# Patient Record
Sex: Male | Born: 1965
Health system: Southern US, Community
[De-identification: ages and names within clinical notes are randomized; demographics above are authoritative.]

## PROBLEM LIST (undated history)

## (undated) DIAGNOSIS — Z8249 Family history of ischemic heart disease and other diseases of the circulatory system: Secondary | ICD-10-CM

## (undated) DIAGNOSIS — K219 Gastro-esophageal reflux disease without esophagitis: Secondary | ICD-10-CM

## (undated) DIAGNOSIS — E785 Hyperlipidemia, unspecified: Secondary | ICD-10-CM

## (undated) DIAGNOSIS — T7840XA Allergy, unspecified, initial encounter: Secondary | ICD-10-CM

## (undated) HISTORY — DX: Allergy, unspecified, initial encounter: T78.40XA

## (undated) HISTORY — DX: Hyperlipidemia, unspecified: E78.5

## (undated) HISTORY — DX: Family history of ischemic heart disease and other diseases of the circulatory system: Z82.49

## (undated) HISTORY — DX: Gastro-esophageal reflux disease without esophagitis: K21.9

## (undated) HISTORY — PX: APPENDECTOMY: SHX54

## (undated) HISTORY — PX: WISDOM TOOTH EXTRACTION: SHX21

## (undated) HISTORY — PX: EYE SURGERY: SHX253

---

## 2001-05-28 ENCOUNTER — Encounter: Admission: RE | Admit: 2001-05-28 | Discharge: 2001-05-28 | Payer: Self-pay | Admitting: Internal Medicine

## 2001-05-28 ENCOUNTER — Encounter: Payer: Self-pay | Admitting: Internal Medicine

## 2005-04-04 ENCOUNTER — Ambulatory Visit: Payer: Self-pay | Admitting: Internal Medicine

## 2005-10-12 ENCOUNTER — Ambulatory Visit: Payer: Self-pay | Admitting: Internal Medicine

## 2006-05-22 ENCOUNTER — Ambulatory Visit: Payer: Self-pay | Admitting: Internal Medicine

## 2006-12-14 ENCOUNTER — Ambulatory Visit: Payer: Self-pay | Admitting: Internal Medicine

## 2006-12-21 ENCOUNTER — Ambulatory Visit: Payer: Self-pay | Admitting: Internal Medicine

## 2006-12-21 LAB — CONVERTED CEMR LAB
ALT: 22 units/L (ref 0–40)
AST: 24 units/L (ref 0–37)
Albumin: 4.3 g/dL (ref 3.5–5.2)
Alkaline Phosphatase: 76 units/L (ref 39–117)
BUN: 13 mg/dL (ref 6–23)
Basophils Absolute: 0 10*3/uL (ref 0.0–0.1)
Basophils Relative: 0.5 % (ref 0.0–1.0)
CO2: 31 meq/L (ref 19–32)
Calcium: 9.8 mg/dL (ref 8.4–10.5)
Chloride: 107 meq/L (ref 96–112)
Creatinine, Ser: 1 mg/dL (ref 0.4–1.5)
Eosinophils Relative: 2.5 % (ref 0.0–5.0)
GFR calc Af Amer: 106 mL/min
GFR calc non Af Amer: 88 mL/min
Glucose, Bld: 93 mg/dL (ref 70–99)
HCT: 41.6 % (ref 39.0–52.0)
Hemoglobin: 14.1 g/dL (ref 13.0–17.0)
Lymphocytes Relative: 31.1 % (ref 12.0–46.0)
MCHC: 33.9 g/dL (ref 30.0–36.0)
MCV: 91.3 fL (ref 78.0–100.0)
Monocytes Absolute: 0.6 10*3/uL (ref 0.2–0.7)
Monocytes Relative: 9.2 % (ref 3.0–11.0)
Neutro Abs: 4 10*3/uL (ref 1.4–7.7)
Neutrophils Relative %: 56.7 % (ref 43.0–77.0)
Platelets: 296 10*3/uL (ref 150–400)
Potassium: 3.9 meq/L (ref 3.5–5.1)
RBC: 4.56 M/uL (ref 4.22–5.81)
RDW: 12 % (ref 11.5–14.6)
Sodium: 142 meq/L (ref 135–145)
TSH: 2.1 microintl units/mL (ref 0.35–5.50)
Total Bilirubin: 0.9 mg/dL (ref 0.3–1.2)
Total Protein: 7.3 g/dL (ref 6.0–8.3)
WBC: 6.9 10*3/uL (ref 4.5–10.5)

## 2006-12-24 ENCOUNTER — Encounter: Payer: Self-pay | Admitting: Internal Medicine

## 2006-12-28 ENCOUNTER — Ambulatory Visit: Payer: Self-pay | Admitting: Internal Medicine

## 2007-01-10 ENCOUNTER — Ambulatory Visit: Payer: Self-pay | Admitting: Internal Medicine

## 2007-01-10 LAB — CONVERTED CEMR LAB
ALT: 19 units/L (ref 0–40)
AST: 20 units/L (ref 0–37)
Albumin: 4.5 g/dL (ref 3.5–5.2)
Alkaline Phosphatase: 75 units/L (ref 39–117)
BUN: 13 mg/dL (ref 6–23)
Basophils Absolute: 0 10*3/uL (ref 0.0–0.1)
Basophils Relative: 0.3 % (ref 0.0–1.0)
Bilirubin, Direct: 0.1 mg/dL (ref 0.0–0.3)
CO2: 30 meq/L (ref 19–32)
Calcium: 9.8 mg/dL (ref 8.4–10.5)
Chloride: 103 meq/L (ref 96–112)
Creatinine, Ser: 1 mg/dL (ref 0.4–1.5)
Eosinophils Absolute: 0 10*3/uL (ref 0.0–0.6)
Eosinophils Relative: 0.8 % (ref 0.0–5.0)
GFR calc Af Amer: 106 mL/min
GFR calc non Af Amer: 88 mL/min
Glucose, Bld: 92 mg/dL (ref 70–99)
HCT: 43.6 % (ref 39.0–52.0)
Hemoglobin: 15.2 g/dL (ref 13.0–17.0)
Hgb A1c MFr Bld: 5 % (ref 4.6–6.0)
Lymphocytes Relative: 24 % (ref 12.0–46.0)
MCHC: 34.8 g/dL (ref 30.0–36.0)
MCV: 91 fL (ref 78.0–100.0)
Monocytes Absolute: 0.5 10*3/uL (ref 0.2–0.7)
Monocytes Relative: 9 % (ref 3.0–11.0)
Neutro Abs: 4 10*3/uL (ref 1.4–7.7)
Neutrophils Relative %: 65.9 % (ref 43.0–77.0)
Platelets: 266 10*3/uL (ref 150–400)
Potassium: 4.2 meq/L (ref 3.5–5.1)
RBC: 4.79 M/uL (ref 4.22–5.81)
RDW: 12 % (ref 11.5–14.6)
Sodium: 138 meq/L (ref 135–145)
TSH: 1.8 microintl units/mL (ref 0.35–5.50)
Total Bilirubin: 0.5 mg/dL (ref 0.3–1.2)
Total Protein: 7.3 g/dL (ref 6.0–8.3)
WBC: 5.9 10*3/uL (ref 4.5–10.5)

## 2007-05-17 ENCOUNTER — Ambulatory Visit: Payer: Self-pay | Admitting: Internal Medicine

## 2007-05-19 LAB — CONVERTED CEMR LAB
Cholesterol: 228 mg/dL (ref 0–200)
Direct LDL: 146.2 mg/dL
HDL: 35.4 mg/dL — ABNORMAL LOW (ref >39.0)
Total CHOL/HDL Ratio: 6.4
Triglycerides: 191 mg/dL — ABNORMAL HIGH (ref 0–149)
VLDL: 38 mg/dL (ref 0–40)

## 2007-09-06 ENCOUNTER — Ambulatory Visit: Payer: Self-pay | Admitting: Internal Medicine

## 2007-09-06 DIAGNOSIS — E782 Mixed hyperlipidemia: Secondary | ICD-10-CM

## 2007-09-06 LAB — CONVERTED CEMR LAB
Cholesterol, target level: 200 mg/dL
HDL goal, serum: 40 mg/dL
LDL Goal: 160 mg/dL

## 2007-10-20 ENCOUNTER — Encounter: Payer: Self-pay | Admitting: Internal Medicine

## 2010-05-03 ENCOUNTER — Ambulatory Visit: Payer: Self-pay | Admitting: Internal Medicine

## 2010-05-04 ENCOUNTER — Encounter: Payer: Self-pay | Admitting: Internal Medicine

## 2010-05-13 ENCOUNTER — Ambulatory Visit: Payer: Self-pay | Admitting: Internal Medicine

## 2010-05-20 LAB — CONVERTED CEMR LAB
ALT: 22 units/L (ref 0–53)
AST: 24 units/L (ref 0–37)
Albumin: 4.2 g/dL (ref 3.5–5.2)
Alkaline Phosphatase: 68 units/L (ref 39–117)
BUN: 16 mg/dL (ref 6–23)
Basophils Absolute: 0 10*3/uL (ref 0.0–0.1)
Basophils Relative: 0.4 % (ref 0.0–3.0)
Bilirubin, Direct: 0.1 mg/dL (ref 0.0–0.3)
CO2: 30 meq/L (ref 19–32)
Calcium: 9.3 mg/dL (ref 8.4–10.5)
Chloride: 106 meq/L (ref 96–112)
Cholesterol: 226 mg/dL — ABNORMAL HIGH (ref 0–200)
Creatinine, Ser: 0.9 mg/dL (ref 0.4–1.5)
Direct LDL: 152.9 mg/dL
Eosinophils Absolute: 0.2 10*3/uL (ref 0.0–0.7)
Eosinophils Relative: 3.3 % (ref 0.0–5.0)
GFR calc non Af Amer: 97.56 mL/min (ref >60)
Glucose, Bld: 91 mg/dL (ref 70–99)
HCT: 42.9 % (ref 39.0–52.0)
HDL: 43.1 mg/dL (ref >39.00)
Hemoglobin: 14.9 g/dL (ref 13.0–17.0)
Lymphocytes Relative: 28.9 % (ref 12.0–46.0)
Lymphs Abs: 1.8 10*3/uL (ref 0.7–4.0)
MCHC: 34.6 g/dL (ref 30.0–36.0)
MCV: 94.1 fL (ref 78.0–100.0)
Monocytes Absolute: 0.7 10*3/uL (ref 0.1–1.0)
Monocytes Relative: 11 % (ref 3.0–12.0)
Neutro Abs: 3.4 10*3/uL (ref 1.4–7.7)
Neutrophils Relative %: 56.4 % (ref 43.0–77.0)
Platelets: 262 10*3/uL (ref 150.0–400.0)
Potassium: 4.5 meq/L (ref 3.5–5.1)
RBC: 4.56 M/uL (ref 4.22–5.81)
RDW: 13.9 % (ref 11.5–14.6)
Sodium: 141 meq/L (ref 135–145)
TSH: 2.03 microintl units/mL (ref 0.35–5.50)
Total Bilirubin: 0.7 mg/dL (ref 0.3–1.2)
Total CHOL/HDL Ratio: 5
Total Protein: 6.9 g/dL (ref 6.0–8.3)
Triglycerides: 128 mg/dL (ref 0.0–149.0)
VLDL: 25.6 mg/dL (ref 0.0–40.0)
WBC: 6.1 10*3/uL (ref 4.5–10.5)

## 2010-10-07 ENCOUNTER — Ambulatory Visit: Payer: Self-pay | Admitting: Internal Medicine

## 2010-10-07 DIAGNOSIS — J069 Acute upper respiratory infection, unspecified: Secondary | ICD-10-CM | POA: Insufficient documentation

## 2010-10-07 DIAGNOSIS — S8990XA Unspecified injury of unspecified lower leg, initial encounter: Secondary | ICD-10-CM | POA: Insufficient documentation

## 2010-10-07 DIAGNOSIS — S99929A Unspecified injury of unspecified foot, initial encounter: Secondary | ICD-10-CM

## 2010-10-07 DIAGNOSIS — S99919A Unspecified injury of unspecified ankle, initial encounter: Secondary | ICD-10-CM

## 2011-01-10 NOTE — Assessment & Plan Note (Signed)
Summary: [employer] physical/nta   Vital Signs:  Patient profile:   45 year old male Height:      68.25 inches Weight:      213 pounds BMI:     32.27 Temp:     98.4 degrees F oral Pulse rate:   80 / minute Resp:     20 per minute BP sitting:   130 / 82  (left arm)  Vitals Entered By: Jeremy Johann CMA (May 03, 2010 3:42 PM)  CC: yearly, General Medical Evaluation, Lipid Management Comments REVIEWED MED LIST, PATIENT AGREED DOSE AND INSTRUCTION CORRECT    CC:  yearly, General Medical Evaluation, and Lipid Management.  History of Present Illness: Ryan Reyes is here for a physical; he is essentially asymptomatic.  Lipid Management History:      Positive NCEP/ATP III risk factors include HDL cholesterol less than 40.  Negative NCEP/ATP III risk factors include male age less than 21 years old, non-diabetic, no family history for ischemic heart disease, non-tobacco-user status, non-hypertensive, no ASHD (atherosclerotic heart disease), no prior stroke/TIA, no peripheral vascular disease, and no history of aortic aneurysm.     Preventive Screening-Counseling & Management  Caffeine-Diet-Exercise     Does Patient Exercise: yes  Allergies: 1)  ! Pcn  Past History:  Past Medical History: ERD with + H.pylori 1999 Hyperlipidemia: NMR 200*: LDL 170(2151/1361),HDL 44, TG 141.LDL goal = < 100 based on NMR. [place] LDL goal + < 160.  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         Weight gain of 4# with carbs.  Physical Exam  General:  well-nourished; alert,appropriate and cooperative throughout examination Head:  Normocephalic and atraumatic without obvious abnormalities. No apparent alopecia  Eyes:  No corneal or conjunctival inflammation noted.Perrla. Funduscopic exam benign, without hemorrhages, exudates or papilledema. Vision grossly normal. Ears:  External ear exam shows no significant lesions or deformities.  Otoscopic examination reveals clear canals, tympanic membranes are intact bilaterally without bulging, retraction, inflammation or discharge. Hearing is grossly normal bilaterally. Nose:  External nasal examination shows no deformity or inflammation. Nasal mucosa are pink and moist without lesions or exudates. Mouth:  Oral mucosa and oropharynx without lesions or exudates.  Teeth in good repair. Neck:  No deformities, masses, or tenderness noted.Slight asymmetry of thyroid w/o nodules Lungs:  Normal respiratory effort, chest expands symmetrically. Lungs are clear to auscultation, no crackles or wheezes. Heart:  Normal rate and regular rhythm. S1 and S2 normal without gallop, murmur, click, rub .S4  Abdomen:  Bowel sounds positive,abdomen soft and non-tender without masses, organomegaly or hernias noted. Rectal:  No external abnormalities noted. Normal sphincter tone. No rectal masses or tenderness. Genitalia:  Testes bilaterally descended without nodularity, tenderness or masses. No scrotal masses or lesions. No penis lesions or urethral discharge. Prostate:  Prostate gland firm and smooth, no enlargement, nodularity, tenderness, mass, asymmetry or induration. Msk:  No deformity or scoliosis  noted of thoracic or lumbar spine.   Pulses:  R and L  carotid,radial,dorsalis pedis and posterior tibial pulses are full and equal bilaterally Extremities:  No clubbing, cyanosis, edema, or deformity noted with normal full range of motion of all joints.   Neurologic:  alert & oriented X3 and DTRs symmetrical and normal.   Skin:  Intact without suspicious lesions or rashes Cervical Nodes:  No lymphadenopathy noted Axillary Nodes:  No palpable lymphadenopathy Psych:  memory intact for recent and remote, normally interactive, and good eye contact.     Impression & Recommendations:  Problem # 1:  ROUTINE GENERAL MEDICAL EXAM@HEALTH  CARE FACL (ICD-V70.0)  Orders: EKG w/ Interpretation (93000)  Problem # 2:  HYPERLIPIDEMIA (ICD-272.2)  Orders: EKG w/ Interpretation (93000)  Problem # 3:  GERD (ICD-530.81) Quiescent His updated medication list for this problem includes:    Nexium 40 Mg Cpdr (Esomeprazole magnesium) .Marland Kitchen... 1 by mouth once daily as needed only  Problem # 4:  CORONARY ARTERY DISEASE, FAMILY HX (ICD-V17.3) F MI @ 57  Complete Medication List: 1)  Nexium 40 Mg Cpdr (Esomeprazole magnesium) .Marland Kitchen.. 1 by mouth once daily as needed only  Other Orders: Tdap => 56yrs IM (73710) Admin 1st Vaccine (62694) Admin 1st Vaccine (State) [phone])  Lipid Assessment/Plan:      Based on NCEP/ATP III, the patient's risk factor category is "0-1 risk factors".  The patient's lipid goals are as follows: Total cholesterol goal is 200; LDL cholesterol goal is 160; HDL cholesterol goal is 40; Triglyceride goal is 150.    Patient Instructions: 1)  It is important that you exercise regularly at least 20 minutes 5 times a week. If you develop chest pain, have severe difficulty breathing, or feel very tired , stop exercising immediately and seek medical attention.Take an 81 mg COATED  Aspirin every day with a meal.Consider fasting labs: 2)  BMP ; 3)  Hepatic Panel; 4)  Lipid Panel ; 5)  TSH ; 6)  CBC w/ Diff . 7)  Avoid foods high in acid (tomatoes,  citrus juices, spicy foods). Avoid eating within two hours of lying down or before exercising. Do not over eat; try smaller more frequent meals. Elevate head of bed twelve inches when sleeping. Consume LESS THAN 40 grams of "sugar "/ day from foods & drinks with High Fructose Corn Syrup as #1,2 or # 3 on label. Prescriptions: NEXIUM 40 MG  CPDR (ESOMEPRAZOLE MAGNESIUM) 1 by mouth once daily as needed only  #30 x 5   Entered and Authorized by:   Marga Melnick MD   Signed by:   Marga Melnick MD on 05/03/2010   Method used:   Print then Give to Patient   RxID:   [id number]    Tetanus/Td Vaccine    Vaccine Type: Tdap    Site: right deltoid    Mfr: [email or web address]    Dose: 0.5 ml    Given by: Jeremy Johann CMA    Exp. Date: 03/04/2012    Lot #: [id number]    VIS given: 10/29/07 version given May 03, 2010.

## 2011-01-10 NOTE — Assessment & Plan Note (Signed)
Summary: sinus pain/jamed toe/cbs   Vital Signs:  Patient profile:   45 year old male Weight:      217.8 pounds BMI:     32.99 Temp:     98.2 degrees F oral Pulse rate:   72 / minute Resp:     14 per minute BP sitting:   116 / 74  (left arm) Cuff size:   large  Vitals Entered By: Shonna Chock CMA (October 07, 2010 2:42 PM) CC: 1.) Sinus Issues x 1 day    2.)  Jamed toes (right foot), URI symptoms, Lower Extremity Joint pain   CC:  1.) Sinus Issues x 1 day    2.)  Jamed toes (right foot), URI symptoms, and Lower Extremity Joint pain.  History of Present Illness:      This is a 45 year old man who presents with  L paranasal pressure.  The patient denies nasal congestion, purulent nasal discharge, sore throat, dry cough, and earache.  The patient denies fever, dyspnea, and wheezing.  The patient denies itchy watery eyes, sneezing, and headache.  Risk factors for Strep sinusitis include only the  unilateral facial pain.  The patient denies the following risk factors for Strep sinusitis: tooth pain and tender adenopathy.   Rx : saline rinse with decreased pain.      The patient also presents with Lower Extremity Joint pain in 2nd & 3rd R toes since 05/2010.  The patient reports decreased ROM, but denies swelling and redness.  The pain began with a direct blow when he fell on beach from "skim" board onto toes. Rx: none.    Current Medications (verified): 1)  Nexium 40 Mg  Cpdr (Esomeprazole Magnesium) .Marland Kitchen.. 1 By Mouth Once Daily As Needed Only  Allergies: 1)  ! Pcn  Physical Exam  General:  well-nourished,in no acute distress; alert,appropriate and cooperative throughout examination Ears:  External ear exam shows no significant lesions or deformities.  Otoscopic examination reveals clear canals, tympanic membranes are intact bilaterally without bulging, retraction, inflammation or discharge. Hearing is grossly normal bilaterally. Nose:  External nasal examination shows no deformity or  inflammation. Nasal mucosa are pink and moist without lesions or exudates. Mouth:  Oral mucosa and oropharynx without lesions or exudates.  Teeth in good repair. Lungs:  Normal respiratory effort, chest expands symmetrically. Lungs are clear to auscultation, no crackles or wheezes. Extremities:  No clubbing, cyanosis, edema, or deformity noted with normal full range of motion of all joints.  Toes not tender to palpation  Skin:  Intact without suspicious lesions or rashes Cervical Nodes:  No lymphadenopathy noted Axillary Nodes:  No palpable lymphadenopathy   Impression & Recommendations:  Problem # 1:  URI (ICD-465.9)  Problem # 2:  TOE INJURY (ICD-959.7)  Complete Medication List: 1)  Nexium 40 Mg Cpdr (Esomeprazole magnesium) .Marland Kitchen.. 1 by mouth once daily as needed only  Patient Instructions: 1)  Neti pot once daily - two times a day as needed for pressure , followed by Nasonex spray. Report " pain , pus & fever".   Orders Added: 1)  Est. Patient Level III [16109]

## 2011-08-10 ENCOUNTER — Encounter: Payer: Self-pay | Admitting: Internal Medicine

## 2011-08-10 ENCOUNTER — Ambulatory Visit (INDEPENDENT_AMBULATORY_CARE_PROVIDER_SITE_OTHER): Payer: 59 | Admitting: Internal Medicine

## 2011-08-10 VITALS — BP 128/80 | HR 84 | Temp 97.7°F | Resp 12 | Ht 68.0 in | Wt 212.6 lb

## 2011-08-10 DIAGNOSIS — E782 Mixed hyperlipidemia: Secondary | ICD-10-CM

## 2011-08-10 DIAGNOSIS — Z0000 Encounter for general adult medical examination without abnormal findings: Secondary | ICD-10-CM

## 2011-08-10 NOTE — Patient Instructions (Addendum)
Preventive Health Care: Exercise at least 30-45 minutes a day,  3-4 days a week.  Eat a low-fat diet with lots of fruits and vegetables, up to 7-9 servings per day. .Consume less than 40 grams of sugar per day from foods & drinks with High Fructose Corn Sugar as # 1,2,3 or # 4 on label. Risk of premature heart attack or stroke increases as LDL or BAD cholesterol rises.Advanced cholesterol panels optimally determine risk base on particle composition ( NMR Lipoprofile ) or by assessing multiple other genetic risks([facility] Heart Panel 1304X). These are indicated when LDL is > 130, especially if there is family history of heart attack in males before 37 or women before 58 .Your LDL goal = < 100. Please review Dr Gildardo Griffes book Eat, Drink & Be Healthy for dietary cholesterol information. If you're having lower extremity cramps; take Mag/Cal ( Cal/Mag) each evening and perform isometrics prior to going to sleep. The best exercises for the low back include freestyle swimming, stretch aerobics, and yoga.  Please  schedule fasting Labs : BMET,Lipids, hepatic panel, CBC & dif, TSH(V70.0).

## 2011-08-10 NOTE — Progress Notes (Signed)
Subjective:    Patient ID: Ryan Reyes, male    DOB: 03-24-1966, 45 y.o.   MRN: [id number]  HPI  Mr. Ryan Reyes  is here for a physical;acute issues include intermittent cramps in legs & feet & intermittent ear pain.      Review of Systems Patient reports no  vision/ hearing changes,anorexia, weight change, fever ,adenopathy, persistant / recurrent hoarseness, swallowing issues, chest pain,palpitations, edema,persistant / recurrent cough, hemoptysis, dyspnea(rest, exertional, paroxysmal nocturnal), gastrointestinal  bleeding (melena, rectal bleeding), abdominal pain, excessive heart burn, GU symptoms( dysuria, hematuria, pyuria, voiding/incontinence  issues) syncope, focal weakness, memory loss,numbness & tingling, skin/hair/nail changes,depression, anxiety or  abnormal bruising/bleeding. He sees a Land for low back issues which occurs after repetitive activity.    Objective:   Physical Exam Gen.: Healthy and well-nourished in appearance. Alert, appropriate and cooperative throughout exam. Head: Normocephalic without obvious abnormalities;  no alopecia  Eyes: No corneal or conjunctival inflammation noted. Pupils equal round reactive to light and accommodation. Fundal exam is benign without hemorrhages, exudate, papilledema. Extraocular motion intact. Vision grossly normal. Ears: External  ear exam reveals no significant lesions or deformities. Canals clear .TMs normal. Hearing is grossly normal bilaterally. Nose: External nasal exam reveals no deformity or inflammation. Nasal mucosa are pink and moist. No lesions or exudates noted. Septum  normal  Mouth: Oral mucosa and oropharynx reveal no lesions or exudates. Teeth in good repair. Neck: No deformities, masses, or tenderness noted. Range of motion & . Thyroid normal. Lungs: Normal respiratory effort; chest expands symmetrically. Lungs are clear to auscultation without rales, wheezes, or increased work of breathing. Heart: Normal rate and  rhythm. Normal S1 and S2. No gallop, click, or rub. S4 w/o  murmur. Abdomen: Bowel sounds normal; abdomen soft and nontender. No masses, organomegaly or hernias noted. Genitalia/DRE : completely normal   .                                                                                   Musculoskeletal/extremities: No deformity or scoliosis noted of  the thoracic or lumbar spine. No clubbing, cyanosis, edema, or deformity noted. Range of motion  normal .Tone & strength  normal.Joints normal. Nail health  good. Vascular: Carotid, radial artery, dorsalis pedis and  posterior tibial pulses are full and equal. No bruits present. Neurologic: Alert and oriented x3. Deep tendon reflexes symmetrical and normal.         Skin: Intact without suspicious lesions or rashes. Lymph: No cervical, axillary, or inguinal lymphadenopathy present. Psych: Mood and affect are normal. Normally interactive                                                                                         Assessment & Plan:  #1 comprehensive physical exam; no acute findings #2 see Problem List with Assessments & Recommendations  #  3 intermittent in legs/feet cramps. Circulation peripherally is excellent   #4 ear pain, intermittent. Probable variant of eustachian tube dysfunction. Plan: see Orders   Note: EKG is normal; there's been slight decrease in the  T voltage in the lateral V. leads since 2011.

## 2011-08-11 ENCOUNTER — Other Ambulatory Visit: Payer: Self-pay | Admitting: Internal Medicine

## 2011-08-11 DIAGNOSIS — Z0000 Encounter for general adult medical examination without abnormal findings: Secondary | ICD-10-CM

## 2011-08-15 ENCOUNTER — Other Ambulatory Visit (INDEPENDENT_AMBULATORY_CARE_PROVIDER_SITE_OTHER): Payer: 59

## 2011-08-15 DIAGNOSIS — Z0000 Encounter for general adult medical examination without abnormal findings: Secondary | ICD-10-CM

## 2011-08-15 LAB — CBC WITH DIFFERENTIAL/PLATELET
Basophils Relative: 0.3 % (ref 0.0–3.0)
Hemoglobin: 14.6 g/dL (ref 13.0–17.0)
Lymphocytes Relative: 25.3 % (ref 12.0–46.0)
Monocytes Relative: 8.5 % (ref 3.0–12.0)
Neutro Abs: 4.5 10*3/uL (ref 1.4–7.7)
RBC: 4.62 Mil/uL (ref 4.22–5.81)

## 2011-08-15 LAB — BASIC METABOLIC PANEL
GFR: 116.13 mL/min (ref >60.00)
Glucose, Bld: 97 mg/dL (ref 70–99)
Potassium: 4 mEq/L (ref 3.5–5.1)
Sodium: 138 mEq/L (ref 135–145)

## 2011-08-15 LAB — LIPID PANEL
Cholesterol: 204 mg/dL — ABNORMAL HIGH (ref 0–200)
HDL: 45.7 mg/dL (ref >39.00)
VLDL: 23.4 mg/dL (ref 0.0–40.0)

## 2011-08-15 LAB — HEPATIC FUNCTION PANEL
Albumin: 4.3 g/dL (ref 3.5–5.2)
Bilirubin, Direct: 0.1 mg/dL (ref 0.0–0.3)
Total Protein: 7.3 g/dL (ref 6.0–8.3)

## 2011-08-15 LAB — LDL CHOLESTEROL, DIRECT: Direct LDL: 143.7 mg/dL

## 2011-08-15 NOTE — Progress Notes (Signed)
Labs only

## 2011-08-18 ENCOUNTER — Telehealth: Payer: Self-pay

## 2011-08-18 MED ORDER — PRAVASTATIN SODIUM 20 MG PO TABS: ORAL_TABLET | ORAL | Status: DC

## 2011-08-18 NOTE — Telephone Encounter (Signed)
Labs and rx mailed to patient

## 2011-08-18 NOTE — Telephone Encounter (Signed)
Message copied by Edgardo Roys on Fri Aug 18, 2011  8:09 AM ------      Message from: Pecola Lawless      Created: Fri Aug 18, 2011  7:05 AM       Risk of premature heart attack or stroke increases as LDL or BAD cholesterol rises.Advanced cholesterol panels optimally determine risk base on particle composition ( NMR Lipoprofile ) or by assessing multiple other genetic risks([place] Heart Panel 1304X). These are indicated when LDL is > 130, especially if there is family history of heart attack in males before 48 or women before 83 Your NMR LDL goal = < 100; ideally <16.      Please consider pravastatin 20 mg at bedtime with repeat fasting labs in 10 weeks (lipids, hepatic panel, CK; 272.4, 995.20). This medicine would cost $10 for 90 pills at [facility] or  [facility], if insurance not used.      All other labs are excellent.[employer]

## 2011-08-23 ENCOUNTER — Telehealth: Payer: Self-pay

## 2011-08-23 NOTE — Telephone Encounter (Signed)
Left message on voicemail for patient to return call when available to discuss medication recommended based on lab response

## 2011-08-23 NOTE — Telephone Encounter (Signed)
Message copied by Edgardo Roys on Wed Aug 23, 2011 11:00 AM ------      Message from: [place], [place] P      Created: Tue Aug 22, 2011 10:26 AM       Called pt to set up fasting labs in 10 weeks after new med and he didn't know anything about new meds or reason for labs--please call patient to advise him per your request

## 2011-08-24 ENCOUNTER — Telehealth: Payer: Self-pay

## 2011-08-24 NOTE — Telephone Encounter (Signed)
Spoke with patient, patient received labs in the mail. Patient with no questions at this time, scheduled appointment for 10/2011 for labs to be rechecked

## 2011-11-03 ENCOUNTER — Other Ambulatory Visit: Payer: Self-pay | Admitting: Internal Medicine

## 2011-11-03 DIAGNOSIS — T887XXA Unspecified adverse effect of drug or medicament, initial encounter: Secondary | ICD-10-CM

## 2011-11-03 DIAGNOSIS — E785 Hyperlipidemia, unspecified: Secondary | ICD-10-CM

## 2011-11-06 ENCOUNTER — Other Ambulatory Visit (INDEPENDENT_AMBULATORY_CARE_PROVIDER_SITE_OTHER): Payer: 59

## 2011-11-06 DIAGNOSIS — T887XXA Unspecified adverse effect of drug or medicament, initial encounter: Secondary | ICD-10-CM

## 2011-11-06 DIAGNOSIS — E785 Hyperlipidemia, unspecified: Secondary | ICD-10-CM

## 2011-11-06 LAB — HEPATIC FUNCTION PANEL
ALT: 34 U/L (ref 0–53)
Albumin: 4.5 g/dL (ref 3.5–5.2)
Total Protein: 7.4 g/dL (ref 6.0–8.3)

## 2011-11-06 LAB — LIPID PANEL
Cholesterol: 187 mg/dL (ref 0–200)
HDL: 45.8 mg/dL (ref >39.00)
LDL Cholesterol: 118 mg/dL — ABNORMAL HIGH (ref 0–99)
VLDL: 23 mg/dL (ref 0.0–40.0)

## 2011-11-06 LAB — CK: Total CK: 306 U/L — ABNORMAL HIGH (ref 7–232)

## 2011-11-06 NOTE — Progress Notes (Signed)
12  

## 2012-08-15 ENCOUNTER — Ambulatory Visit (INDEPENDENT_AMBULATORY_CARE_PROVIDER_SITE_OTHER): Payer: 59 | Admitting: Internal Medicine

## 2012-08-15 ENCOUNTER — Encounter: Payer: Self-pay | Admitting: Internal Medicine

## 2012-08-15 VITALS — BP 124/82 | HR 67 | Temp 98.2°F | Ht 67.08 in | Wt 214.6 lb

## 2012-08-15 DIAGNOSIS — K219 Gastro-esophageal reflux disease without esophagitis: Secondary | ICD-10-CM

## 2012-08-15 DIAGNOSIS — Z0000 Encounter for general adult medical examination without abnormal findings: Secondary | ICD-10-CM

## 2012-08-15 DIAGNOSIS — E782 Mixed hyperlipidemia: Secondary | ICD-10-CM

## 2012-08-15 LAB — BASIC METABOLIC PANEL
BUN: 19 mg/dL (ref 6–23)
CO2: 26 mEq/L (ref 19–32)
Calcium: 9.7 mg/dL (ref 8.4–10.5)
Chloride: 103 mEq/L (ref 96–112)
Creatinine, Ser: 1 mg/dL (ref 0.4–1.5)
Glucose, Bld: 80 mg/dL (ref 70–99)

## 2012-08-15 LAB — CBC WITH DIFFERENTIAL/PLATELET
Basophils Absolute: 0 10*3/uL (ref 0.0–0.1)
Basophils Relative: 0.4 % (ref 0.0–3.0)
Eosinophils Absolute: 0.1 10*3/uL (ref 0.0–0.7)
MCHC: 33.5 g/dL (ref 30.0–36.0)
MCV: 92.6 fl (ref 78.0–100.0)
Monocytes Absolute: 0.5 10*3/uL (ref 0.1–1.0)
Neutrophils Relative %: 66 % (ref 43.0–77.0)
Platelets: 261 10*3/uL (ref 150.0–400.0)
RDW: 13.6 % (ref 11.5–14.6)

## 2012-08-15 LAB — LIPID PANEL
HDL: 44.7 mg/dL (ref >39.00)
Triglycerides: 120 mg/dL (ref 0.0–149.0)
VLDL: 24 mg/dL (ref 0.0–40.0)

## 2012-08-15 LAB — HEPATIC FUNCTION PANEL
Bilirubin, Direct: 0.1 mg/dL (ref 0.0–0.3)
Total Bilirubin: 0.8 mg/dL (ref 0.3–1.2)
Total Protein: 7.5 g/dL (ref 6.0–8.3)

## 2012-08-15 LAB — TSH: TSH: 1.54 u[IU]/mL (ref 0.35–5.50)

## 2012-08-15 MED ORDER — ESOMEPRAZOLE MAGNESIUM 40 MG PO CPDR: 40.0000 mg | DELAYED_RELEASE_CAPSULE | Freq: Every day | ORAL | Status: DC | PRN

## 2012-08-15 NOTE — Progress Notes (Signed)
  Subjective:    Patient ID: Ryan Reyes, male    DOB: 1966/11/19, 46 y.o.   MRN: [id number]  HPI  Ryan Reyes is here for a physical;acute issues include some dyspepsia, averaging twice per week      Review of Systems He denies persistent hoarseness, dysphagia, unexplained weight loss, abdominal pain, melena, or rectal bleeding.  He is on no specific diet and is not on a regular exercise program. He did not refill his statin after the initial prescription; he has been off the medicine for at least 6 months.  He denies exertional chest pain, dyspnea, palpitations, edema, or claudication.     Objective:   Physical Exam Gen.: Healthy and well-nourished in appearance. Alert, appropriate and cooperative throughout exam.Appears younger than stated age  Head: Normocephalic without obvious abnormalities  Eyes: No corneal or conjunctival inflammation noted. Pupils equal round reactive to light and accommodation. Fundal exam is benign without hemorrhages, exudate, papilledema. Extraocular motion intact. Vision grossly normal. Ears: External  ear exam reveals no significant lesions or deformities. Canals clear .TMs normal. Hearing is grossly normal bilaterally. Nose: External nasal exam reveals no deformity or inflammation. Nasal mucosa are pink and moist. No lesions or exudates noted.   Mouth: Oral mucosa and oropharynx reveal no lesions or exudates. Teeth in good repair. Neck: No deformities, masses, or tenderness noted. Range of motion & Thyroid normal. Lungs: Normal respiratory effort; chest expands symmetrically. Lungs are clear to auscultation without rales, wheezes, or increased work of breathing. Heart: Normal rate and rhythm. Normal S1 and S2. No gallop, click, or rub. S4 w/o murmur. Abdomen: Bowel sounds normal; abdomen soft and nontender. No masses, organomegaly or hernias noted. Genitalia/ DRE: Genitalia normal .Prostate is normal without enlargement, asymmetry, nodularity, or induration.   Musculoskeletal/extremities: No deformity or scoliosis noted of  the thoracic or lumbar spine. No clubbing, cyanosis, edema, or deformity noted. Range of motion  normal .Tone & strength  normal.Joints normal. Nail health  good. Vascular: Carotid, radial artery, dorsalis pedis and  posterior tibial pulses are full and equal. No bruits present. Neurologic: Alert and oriented x3. Deep tendon reflexes symmetrical and normal.         Skin: Intact without suspicious lesions or rashes. Multiple benign nevi Lymph: No cervical, axillary, or inguinal lymphadenopathy present. Psych: Mood and affect are normal. Normally interactive                                                                                        Assessment & Plan:  #1 comprehensive physical exam; no acute findings #2 see Problem List with Assessments & Recommendations Plan: see Orders

## 2012-08-15 NOTE — Patient Instructions (Addendum)
Preventive Health Care: Exercise at least 30-45 minutes a day,  3-4 days a week.  Eat a low-fat diet with lots of fruits and vegetables, up to 7-9 servings per day.  Consume less than 40 grams of sugar per day from foods & drinks with High Fructose Corn Sugar as # 1,2,3 or # 4 on label. The triggers for dyspepsia or "heart burn"  include stress; the "aspirin family" ; alcohol; peppermint; and caffeine (coffee, tea, cola, and chocolate). The aspirin family would include aspirin and the nonsteroidal agents such as ibuprofen &  Naproxen. Tylenol would not cause reflux. If having dyspepsia ; food & drink should be avoided for @ least 2 hours before going to bed.   If you activate My Chart; the results can be released to you as soon as they populate from the lab. If you choose not to use this program; the labs have to be reviewed, copied & mailed   causing a delay in getting the results to you.

## 2012-12-30 ENCOUNTER — Encounter: Payer: Self-pay | Admitting: Internal Medicine

## 2012-12-30 ENCOUNTER — Ambulatory Visit (INDEPENDENT_AMBULATORY_CARE_PROVIDER_SITE_OTHER): Payer: 59 | Admitting: Internal Medicine

## 2012-12-30 VITALS — BP 120/84 | HR 80 | Temp 98.2°F | Wt 213.0 lb

## 2012-12-30 DIAGNOSIS — E782 Mixed hyperlipidemia: Secondary | ICD-10-CM

## 2012-12-30 DIAGNOSIS — M546 Pain in thoracic spine: Secondary | ICD-10-CM

## 2012-12-30 MED ORDER — SIMVASTATIN 20 MG PO TABS: 20.0000 mg | ORAL_TABLET | Freq: Every day | ORAL | Status: DC

## 2012-12-30 NOTE — Progress Notes (Signed)
  Subjective:    Patient ID: Ryan Reyes, male    DOB: Dec 07, 1966, 47 y.o.   MRN: [id number]  HPI #1 ? Abnormality upper back Onset:as soreness as of few weeks ago Trigger/injury:not definitely but started after weight lifting Pain, redness swelling:seems raised but non tender Constitutional: No associated fever, chills, sweats, weight change Heme: No abnormal bruising or clotting, lymphadenopathy Treatment/response:none  #2 Dyslipidemia assessment: Prior Advanced Lipid Testing: 2008 NMR LDL goal = , 100, ideally < 80.   Family history of premature CVA/CAD/ MI: father had CVA < 72, fatal MI @ 22 .  Nutrition: low carb .  Exercise: elliptical 30 min 3X/ week . Diabetes : no . HTN: no. Smoking history  :never .    Lab results reviewed :LDL 153.3.     Review of Systems Weight :  stable.  fatigue: no ; chest pain :no ;claudication: no; palpitations:no; abd pain/bowel changes: only acute gastroenteritis last week ; myalgias:no ;  syncope : no ; memory loss: no;skin changes: no.     Objective:   Physical Exam Gen.: Healthy and well-nourished in appearance. Alert, appropriate and cooperative throughout exam. Appears younger than stated age   Eyes: No corneal or conjunctival inflammation noted.  Mouth: Oral mucosa and oropharynx reveal no lesions or exudates. Teeth in good repair. Neck: No deformities, masses, or tenderness noted. Range of motion &. Thyroid normal. Lungs: Normal respiratory effort; chest expands symmetrically. Lungs are clear to auscultation without rales, wheezes, or increased work of breathing. Heart: Normal rate and rhythm. Normal S1 and S2. No gallop, click, or rub. S4 w/o murmur. Abdomen: Bowel sounds normal; abdomen soft and nontender. No masses, organomegaly or hernias noted.                                  Musculoskeletal/extremities: Slightly accentuated curvature of upper thoracic  spine. . No clubbing, cyanosis, edema, or significant extremity  deformity  noted. Range of motion normal .Tone & strength  normal.Joints normal . Nail health good. Able to lie down & sit up w/o help. Negative SLR bilaterally Vascular: Carotid, radial artery, dorsalis pedis and  posterior tibial pulses are full and equal. No bruits present. Neurologic: Alert and oriented x3. Deep tendon reflexes symmetrical and normal. No cervical nerve deficit Skin: Intact without suspicious lesions or rashes. Lymph: No cervical, axillary lymphadenopathy present. Psych: Mood and affect are normal. 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  #2 dyslipidemia with significantly increased risk. Family history of premature stroke and fatal MI.  Plan: I would recommend avoiding repetitive motion of the neck and upper back. He should focus on ergonomics at the workplace particularly in reference to placement of the computer and upper back support. Cervical pillow would be indicated

## 2012-12-30 NOTE — Patient Instructions (Addendum)
Cardiovascular exercise is recommended 30-45 minutes 3-4 times per week. If you're not exercising you should take 6-8 weeks to build up to this level.  Please take enteric-coated aspirin 81 mg daily with breakfast. Please  schedule fasting Labs after 10 weeks of statin : Lipids, hepatic panel, CK. PLEASE BRING THESE INSTRUCTIONS TO FOLLOW UP  LAB APPOINTMENT.This will guarantee correct labs are drawn, eliminating need for repeat blood sampling ( needle sticks ! ). Diagnoses /Codes: 272.4,995.20,V17.3 Use an anti-inflammatory cream such as Aspercreme or Zostrix cream twice a day to the upper back/neck as needed. In lieu of this warm moist compresses or  hot water bottle can be used. Do not apply ice .Consider a cervical memory foam pillow to prevent hyperextension or hyperflexion of the cervical spine.   Assess ergonomic risks for repetitive neck/spine movement at work. Usually this is related to the height @ which  computer  is placed and lack of back support while seated  at the desk.

## 2013-01-25 ENCOUNTER — Other Ambulatory Visit: Payer: Self-pay

## 2013-08-18 ENCOUNTER — Encounter: Payer: Self-pay | Admitting: Internal Medicine

## 2013-08-18 ENCOUNTER — Ambulatory Visit (INDEPENDENT_AMBULATORY_CARE_PROVIDER_SITE_OTHER): Payer: 59 | Admitting: Internal Medicine

## 2013-08-18 VITALS — BP 132/79 | HR 64 | Temp 98.5°F | Ht 68.25 in | Wt 219.6 lb

## 2013-08-18 DIAGNOSIS — Z8739 Personal history of other diseases of the musculoskeletal system and connective tissue: Secondary | ICD-10-CM | POA: Insufficient documentation

## 2013-08-18 DIAGNOSIS — Z1331 Encounter for screening for depression: Secondary | ICD-10-CM

## 2013-08-18 DIAGNOSIS — E782 Mixed hyperlipidemia: Secondary | ICD-10-CM

## 2013-08-18 DIAGNOSIS — Z0000 Encounter for general adult medical examination without abnormal findings: Secondary | ICD-10-CM

## 2013-08-18 LAB — CBC WITH DIFFERENTIAL/PLATELET
Eosinophils Absolute: 0.1 10*3/uL (ref 0.0–0.7)
MCHC: 34.5 g/dL (ref 30.0–36.0)
MCV: 90.6 fl (ref 78.0–100.0)
Monocytes Absolute: 0.5 10*3/uL (ref 0.1–1.0)
Neutrophils Relative %: 62.3 % (ref 43.0–77.0)
Platelets: 235 10*3/uL (ref 150.0–400.0)
WBC: 6 10*3/uL (ref 4.5–10.5)

## 2013-08-18 LAB — HEPATIC FUNCTION PANEL
ALT: 26 U/L (ref 0–53)
AST: 25 U/L (ref 0–37)
Bilirubin, Direct: 0 mg/dL (ref 0.0–0.3)
Total Bilirubin: 0.5 mg/dL (ref 0.3–1.2)
Total Protein: 6.7 g/dL (ref 6.0–8.3)

## 2013-08-18 LAB — TSH: TSH: 1.45 u[IU]/mL (ref 0.35–5.50)

## 2013-08-18 LAB — BASIC METABOLIC PANEL
BUN: 13 mg/dL (ref 6–23)
CO2: 27 mEq/L (ref 19–32)
Chloride: 106 mEq/L (ref 96–112)
Creatinine, Ser: 0.8 mg/dL (ref 0.4–1.5)

## 2013-08-18 LAB — LIPID PANEL
Total CHOL/HDL Ratio: 5
VLDL: 40.8 mg/dL — ABNORMAL HIGH (ref 0.0–40.0)

## 2013-08-18 NOTE — Progress Notes (Signed)
  Subjective:    Patient ID: Ryan Reyes, male    DOB: 1966-11-27, 47 y.o.   MRN: [id number]  HPI He is here for a physical;acute issues denied except  For intermittent soreness in heels for past year especially in am after arising.     Review of Systems He is on a low carb diet; he exercises irregularly.He denies chest pain, palpitations, dyspnea, or claudication.  Family history is positive for premature stroke in his  father.  Advanced cholesterol testing reveals his LDL goal is less than 100, ideally < 80.  His plan was  to engage in a preventive health care program rather than taking the statin which was prescribed last year. Full dose aspirin in  the past has caused gastritis.     Objective:   Physical Exam Gen.: Healthy and well-nourished in appearance. Alert, appropriate and cooperative throughout exam.Appears younger than stated age  Head: Normocephalic without obvious abnormalities;  no alopecia  Eyes: No corneal or conjunctival inflammation noted. Pupils equal round reactive to light and accommodation.  Extraocular motion intact. Vision grossly normal without lenses Ears: External  ear exam reveals no significant lesions or deformities. Canals clear .TMs normal. Hearing is grossly normal bilaterally. Nose: External nasal exam reveals no deformity or inflammation. Nasal mucosa are pink and moist. No lesions or exudates noted.   Mouth: Oral mucosa and oropharynx reveal no lesions or exudates. Teeth in good repair. Neck: No deformities, masses, or tenderness noted. Range of motion &. Thyroid normal. Lungs: Normal respiratory effort; chest expands symmetrically. Lungs are clear to auscultation without rales, wheezes, or increased work of breathing. Heart: Normal rate and rhythm. Normal S1 and S2. No gallop, click, or rub. No murmur. Abdomen: Bowel sounds normal; abdomen soft and nontender. No masses, organomegaly or hernias noted. Genitalia: Genitalia normal except for left  varices. Prostate is normal without enlargement, asymmetry, nodularity, or induration.                                    Musculoskeletal/extremities: There is some asymmetry of the posterior thoracic musculature suggesting occult scoliosis. No clubbing, cyanosis, edema, or significant extremity  deformity noted. Range of motion normal .Tone & strength  Normal. Joints normal . Nail health good. Pes planus present. No tenderness to percussion over the plantar fascia, heels, or Achilles tendons Able to lie down & sit up w/o help. Negative SLR bilaterally Vascular: Carotid, radial artery, dorsalis pedis and  posterior tibial pulses are full and equal. No bruits present. Neurologic: Alert and oriented x3. Deep tendon reflexes symmetrical and normal.         Skin: Intact without suspicious lesions or rashes. Lymph: No cervical, axillary, or inguinal lymphadenopathy present. Psych: Mood and affect are normal. Normally interactive                                                                                        Assessment & Plan:  #1 comprehensive physical exam; no acute findings #2 heel pain in context of pes planus  Plan: see Orders  & Recommendations

## 2013-08-18 NOTE — Patient Instructions (Addendum)
Preventive Health Care: Exercise at least 30-45 minutes a day,  3-4 days a week.  Eat a low-fat diet with lots of fruits and vegetables, up to 7-9 servings per day. This would eliminate the need for vitamin supplements. Consume less than 40 grams of sugar (preferably ZERO) per day from foods & drinks with High Fructose Corn Sugar as #1,2,3 or # 4 on label. Please take enteric-coated aspirin 81 mg daily with breakfast. Roll the affected foot over a tennis ball 20 times 1-2 a day. After this soak the foot in warm Epsom salts for 15-20 minutes. Wear arch supports in both shoes. Podiatry referral if symptoms persist.

## 2013-08-20 ENCOUNTER — Other Ambulatory Visit: Payer: Self-pay | Admitting: Internal Medicine

## 2013-08-20 DIAGNOSIS — E782 Mixed hyperlipidemia: Secondary | ICD-10-CM

## 2013-08-21 MED ORDER — SIMVASTATIN 20 MG PO TABS: 20.0000 mg | ORAL_TABLET | Freq: Every day | ORAL | Status: DC

## 2013-10-16 ENCOUNTER — Other Ambulatory Visit: Payer: Self-pay

## 2013-11-17 ENCOUNTER — Telehealth: Payer: Self-pay | Admitting: Internal Medicine

## 2013-11-17 NOTE — Telephone Encounter (Signed)
Patient states that he just finished his 1st 90-day dose of his Simvastatin rx and wants to know if Dr. Alwyn Ren wants him to have any labs done to see if it is working. Please advise.

## 2013-11-18 ENCOUNTER — Other Ambulatory Visit: Payer: Self-pay | Admitting: *Deleted

## 2013-11-18 DIAGNOSIS — E785 Hyperlipidemia, unspecified: Secondary | ICD-10-CM

## 2013-11-18 DIAGNOSIS — T887XXA Unspecified adverse effect of drug or medicament, initial encounter: Secondary | ICD-10-CM

## 2013-11-18 NOTE — Telephone Encounter (Signed)
Would it be appropriate for patient to schedule labs now or at a future date? Please advise.

## 2013-11-18 NOTE — Telephone Encounter (Signed)
Fasting labs should be drawn while he is still on the medication. Appropriate labs are lipids, hepatic panel, and CK. Codes: 272.4, 995.20

## 2013-11-18 NOTE — Telephone Encounter (Signed)
Called and left message for patient to call back and schedule lab appt. Labs entered into Epic. JG//CMA

## 2013-11-20 ENCOUNTER — Other Ambulatory Visit (INDEPENDENT_AMBULATORY_CARE_PROVIDER_SITE_OTHER): Payer: 59

## 2013-11-20 DIAGNOSIS — E785 Hyperlipidemia, unspecified: Secondary | ICD-10-CM

## 2013-11-20 DIAGNOSIS — T887XXA Unspecified adverse effect of drug or medicament, initial encounter: Secondary | ICD-10-CM

## 2013-11-20 LAB — LIPID PANEL
Cholesterol: 155 mg/dL (ref 0–200)
HDL: 46.4 mg/dL (ref >39.00)
Triglycerides: 118 mg/dL (ref 0.0–149.0)

## 2013-11-20 LAB — HEPATIC FUNCTION PANEL
AST: 32 U/L (ref 0–37)
Albumin: 4.3 g/dL (ref 3.5–5.2)
Alkaline Phosphatase: 62 U/L (ref 39–117)
Total Protein: 7.4 g/dL (ref 6.0–8.3)

## 2014-02-10 ENCOUNTER — Other Ambulatory Visit: Payer: Self-pay | Admitting: Family Medicine

## 2014-04-15 ENCOUNTER — Ambulatory Visit (INDEPENDENT_AMBULATORY_CARE_PROVIDER_SITE_OTHER): Payer: 59 | Admitting: Internal Medicine

## 2014-04-15 ENCOUNTER — Encounter: Payer: Self-pay | Admitting: Internal Medicine

## 2014-04-15 VITALS — BP 130/80 | HR 71 | Temp 98.0°F | Resp 12 | Wt 216.0 lb

## 2014-04-15 DIAGNOSIS — M545 Low back pain, unspecified: Secondary | ICD-10-CM

## 2014-04-15 MED ORDER — CYCLOBENZAPRINE HCL 5 MG PO TABS: ORAL_TABLET | ORAL | Status: DC

## 2014-04-15 MED ORDER — TRAMADOL HCL 50 MG PO TABS: 50.0000 mg | ORAL_TABLET | Freq: Four times a day (QID) | ORAL | Status: DC | PRN

## 2014-04-15 NOTE — Patient Instructions (Signed)
The best exercises for the low back include freestyle swimming, stretch aerobics, and yoga.Cybex & Nautilus machines rather than dead weights are better for the back. 

## 2014-04-15 NOTE — Progress Notes (Signed)
   Subjective:    Patient ID: Ryan Reyes, male    DOB: [birth year], 48 y.o.   MRN: [id number]  HPI   Symptoms began 04/09/14 and upon awakening. It was described as sharp pain in the left lower back at the waist level while walking. It was described as dull - sharp and lasting minutes. There have been no specific injury or trigger although he played 9 holes of golf the day before  It was associated with some numbness and tingling in the left posterior thigh. The pain does not radiate below the knee  He was using topical heating pads with some response    Review of Systems  He did not experience fever, chills, sweats, weight loss  He had no abdominal pain, melena, rectal bleeding, or change in bowels  He denied dysuria, pyuria, or hematuria  He had no associated rash or change in the color/temperature of skin in the area pain  No urine or bowel incontinence.      Objective:   Physical Exam General appearance is one of good health and nourishment w/o distress.  Eyes: No conjunctival inflammation or scleral icterus is present.  Heart:  Normal rate and regular rhythm. S1 and S2 normal without gallop, murmur, click, rub or other extra sounds     Lungs:Chest clear to auscultation; no wheezes, rhonchi,rales ,or rubs present.No increased work of breathing.   Abdomen: bowel sounds normal, soft and non-tender without masses, organomegaly or hernias noted.  No guarding or rebound . No tenderness over the flanks to percussion. No AAA  Musculoskeletal: Able to lie flat and sit up without help. Negative straight leg raising bilaterally. Gait normal including heel & toe walking. Strength , tone & DTRs WNL  Skin:Warm & dry.  Intact without suspicious lesions or rashes ; no jaundice or tenting  Lymphatic: No lymphadenopathy is noted about the head, neck, axilla             Assessment & Plan:  #1 LBP syndrome See AVS & orders

## 2014-04-15 NOTE — Progress Notes (Signed)
Pre visit review using our clinic review tool, if applicable. No additional management support is needed unless otherwise documented below in the visit note. 

## 2014-08-20 ENCOUNTER — Other Ambulatory Visit (INDEPENDENT_AMBULATORY_CARE_PROVIDER_SITE_OTHER): Payer: 59

## 2014-08-20 ENCOUNTER — Ambulatory Visit (INDEPENDENT_AMBULATORY_CARE_PROVIDER_SITE_OTHER): Payer: 59 | Admitting: Internal Medicine

## 2014-08-20 ENCOUNTER — Encounter: Payer: Self-pay | Admitting: Internal Medicine

## 2014-08-20 VITALS — BP 112/78 | HR 68 | Temp 98.3°F | Ht 68.0 in | Wt 214.4 lb

## 2014-08-20 DIAGNOSIS — E782 Mixed hyperlipidemia: Secondary | ICD-10-CM

## 2014-08-20 DIAGNOSIS — Z0000 Encounter for general adult medical examination without abnormal findings: Secondary | ICD-10-CM

## 2014-08-20 LAB — HEPATIC FUNCTION PANEL
ALK PHOS: 68 U/L (ref 39–117)
ALT: 30 U/L (ref 0–53)
AST: 28 U/L (ref 0–37)
Albumin: 4.3 g/dL (ref 3.5–5.2)
BILIRUBIN DIRECT: 0.1 mg/dL (ref 0.0–0.3)
Total Bilirubin: 0.8 mg/dL (ref 0.2–1.2)
Total Protein: 7.4 g/dL (ref 6.0–8.3)

## 2014-08-20 LAB — CBC WITH DIFFERENTIAL/PLATELET
Basophils Absolute: 0 10*3/uL (ref 0.0–0.1)
Basophils Relative: 0.4 % (ref 0.0–3.0)
EOS ABS: 0.1 10*3/uL (ref 0.0–0.7)
Eosinophils Relative: 2.2 % (ref 0.0–5.0)
HCT: 43.4 % (ref 39.0–52.0)
Hemoglobin: 14.8 g/dL (ref 13.0–17.0)
LYMPHS PCT: 27.9 % (ref 12.0–46.0)
Lymphs Abs: 1.7 10*3/uL (ref 0.7–4.0)
MCHC: 34.1 g/dL (ref 30.0–36.0)
MCV: 91.5 fl (ref 78.0–100.0)
Monocytes Absolute: 0.5 10*3/uL (ref 0.1–1.0)
Monocytes Relative: 8.6 % (ref 3.0–12.0)
NEUTROS PCT: 60.9 % (ref 43.0–77.0)
Neutro Abs: 3.7 10*3/uL (ref 1.4–7.7)
Platelets: 248 10*3/uL (ref 150.0–400.0)
RBC: 4.75 Mil/uL (ref 4.22–5.81)
RDW: 13.1 % (ref 11.5–15.5)
WBC: 6.1 10*3/uL (ref 4.0–10.5)

## 2014-08-20 LAB — LIPID PANEL
CHOL/HDL RATIO: 3
Cholesterol: 150 mg/dL (ref 0–200)
HDL: 43.3 mg/dL (ref >39.00)
LDL CALC: 83 mg/dL (ref 0–99)
NONHDL: 106.7
Triglycerides: 119 mg/dL (ref 0.0–149.0)
VLDL: 23.8 mg/dL (ref 0.0–40.0)

## 2014-08-20 LAB — BASIC METABOLIC PANEL
BUN: 13 mg/dL (ref 6–23)
CALCIUM: 9.6 mg/dL (ref 8.4–10.5)
CO2: 29 meq/L (ref 19–32)
CREATININE: 1 mg/dL (ref 0.4–1.5)
Chloride: 103 mEq/L (ref 96–112)
GFR: 84.77 mL/min (ref >60.00)
Glucose, Bld: 92 mg/dL (ref 70–99)
Potassium: 4.5 mEq/L (ref 3.5–5.1)
Sodium: 139 mEq/L (ref 135–145)

## 2014-08-20 LAB — TSH: TSH: 1.83 u[IU]/mL (ref 0.35–4.50)

## 2014-08-20 NOTE — Patient Instructions (Addendum)
Your next office appointment will be determined based upon review of your pending labs. Those instructions will be transmitted to you through My Chart . 

## 2014-08-20 NOTE — Progress Notes (Signed)
Subjective:    Patient ID: Ryan Reyes, male    DOB: 1965-12-21, 48 y.o.   MRN: [id number]  HPI He is here for a physical;acute issues denied.  A heart healthy diet is followed; exercise encompasses 30 minutes 3  times per week as  elliptical without symptoms.  Family history is borderine for premature coronary disease. Advanced cholesterol testing reveals  LDL goal is less than 100; ideally < 70 . There is medication compliance with the statin.  Low dose ASA taken   Review of Systems  Specifically denied are  chest pain, palpitations, dyspnea, or claudication.  Significant abdominal symptoms, memory deficit, or myalgias not present.        Objective:   Physical Exam Gen.: Healthy and well-nourished in appearance. Alert, appropriate and cooperative throughout exam. Appears younger than stated age  Head: Normocephalic without obvious abnormalities; no alopecia  Eyes: No corneal or conjunctival inflammation noted. Pupils equal round reactive to light and accommodation. Extraocular motion intact.  Ears: External  ear exam reveals no significant lesions or deformities. Canals clear .TMs normal. Hearing is grossly normal bilaterally. Nose: External nasal exam reveals no deformity or inflammation. Nasal mucosa are pink and moist. No lesions or exudates noted.   Mouth: Oral mucosa and oropharynx reveal no lesions or exudates. Teeth in good repair. Neck: No deformities, masses, or tenderness noted. Range of motion & Thyroid normal. Lungs: Normal respiratory effort; chest expands symmetrically. Lungs are clear to auscultation without rales, wheezes, or increased work of breathing. Heart: Normal rate and rhythm. Normal S1 and S2. No gallop, click, or rub. No murmur. Abdomen: Bowel sounds normal; abdomen soft and nontender. No masses, organomegaly or hernias noted. Genitalia: Genitalia normal except for left varices. Prostate is normal without enlargement, asymmetry, nodularity, or  induration                            Musculoskeletal/extremities: No deformity or scoliosis noted of  the thoracic or lumbar spine.  No clubbing, cyanosis, edema, or significant extremity  deformity noted. Range of motion normal .Tone & strength normal. Hand joints normal Fingernail  health good. Able to lie down & sit up w/o help. Negative SLR bilaterally Vascular: Carotid, radial artery, dorsalis pedis and  posterior tibial pulses are full and equal. No bruits present. Neurologic: Alert and oriented x3. Deep tendon reflexes symmetrical and normal.  Gait normal.       Skin: Intact without suspicious lesions or rashes. Lymph: No cervical, axillary, or inguinal lymphadenopathy present. Psych: Mood and affect are normal. Normally interactive                                                                                        Assessment & Plan:                                                                                                    #  1 comprehensive physical exam; no acute findings  Plan: see Orders  & Recommendations

## 2014-08-20 NOTE — Progress Notes (Signed)
Pre visit review using our clinic review tool, if applicable. No additional management support is needed unless otherwise documented below in the visit note. 

## 2014-08-27 ENCOUNTER — Other Ambulatory Visit: Payer: Self-pay | Admitting: Family Medicine

## 2014-09-28 ENCOUNTER — Other Ambulatory Visit: Payer: Self-pay

## 2014-09-28 MED ORDER — SIMVASTATIN 20 MG PO TABS: ORAL_TABLET | ORAL | Status: DC

## 2014-10-06 ENCOUNTER — Telehealth: Payer: Self-pay | Admitting: Internal Medicine

## 2014-10-06 DIAGNOSIS — K219 Gastro-esophageal reflux disease without esophagitis: Secondary | ICD-10-CM

## 2014-10-06 MED ORDER — ESOMEPRAZOLE MAGNESIUM 40 MG PO CPDR: 40.0000 mg | DELAYED_RELEASE_CAPSULE | Freq: Every day | ORAL | Status: DC | PRN

## 2014-10-06 NOTE — Telephone Encounter (Signed)
Pt needs refill of Nexium. [facility] on [address], [place].

## 2015-03-28 ENCOUNTER — Other Ambulatory Visit: Payer: Self-pay | Admitting: Internal Medicine

## 2015-07-20 ENCOUNTER — Encounter: Payer: Self-pay | Admitting: Internal Medicine

## 2015-07-20 ENCOUNTER — Ambulatory Visit (INDEPENDENT_AMBULATORY_CARE_PROVIDER_SITE_OTHER): Payer: 59 | Admitting: Internal Medicine

## 2015-07-20 VITALS — BP 135/86 | HR 69 | Temp 98.3°F | Resp 18 | Wt 220.0 lb

## 2015-07-20 DIAGNOSIS — L959 Vasculitis limited to the skin, unspecified: Secondary | ICD-10-CM | POA: Diagnosis not present

## 2015-07-20 MED ORDER — DOXYCYCLINE HYCLATE 100 MG PO TABS: 100.0000 mg | ORAL_TABLET | Freq: Two times a day (BID) | ORAL | Status: DC

## 2015-07-20 NOTE — Patient Instructions (Signed)
Apply Cort Aid OTC twice a day to the involved tissues.   Please report warning signs as we discussed. Worrisome would be red streaks up the extremity, increased pain, fever, or pus production.

## 2015-07-20 NOTE — Progress Notes (Signed)
Pre visit review using our clinic review tool, if applicable. No additional management support is needed unless otherwise documented below in the visit note. 

## 2015-07-20 NOTE — Progress Notes (Signed)
   Subjective:    Patient ID: Ryan Reyes, male    DOB: [birth year], 49 y.o.   MRN: [id number]  HPI   He [place]18[place]15 [place] He was wearing shorts and felt a bite on his left posterior calf. The vector was not visualized. He cleaned the site with an antiseptic product at the golf course. He also used H2O2. Since then he has used Benadryl for itching.   Review of Systems   He denies associated abdominal pain, nausea or vomiting.  No associated itchy, watery eyes.  Swelling of the lips or tongue denied.  Shortness of breath, wheezing, or cough absent.  No definite urticaria noted.  Fever ,chills , or sweats denied. Purulence absent.  Diarrhea not present.      Objective:   Physical Exam  Pertinent or positive findings include: He has 2 erythematous plaque-like lesions which are slightly raised over the left calf. One is 3.5 x 2.5 cm and inferior to that is a 3 x 2.5 cm lesion. There is no vesicle,maceration or pustule formation. No ulcers noted. These areas do blanch with pressure. The superior lesion feels somewhat warm.  General appearance :adequately nourished; in no distress.  Eyes: No conjunctival inflammation or scleral icterus is present.  Oral exam:  Lips and gums are healthy appearing.There is no oropharyngeal erythema or exudate noted. Dental hygiene is good.  Heart:  Normal rate and regular rhythm. S1 and S2 normal without gallop, murmur, click, rub or other extra sounds    Lungs:Chest clear to auscultation; no wheezes, rhonchi,rales ,or rubs present.No increased work of breathing.   Vascular : all pulses equal ; no bruits present.  Skin:Warm & dry; no tenting or jaundice   Lymphatic: No lymphadenopathy is noted about the head, neck, axilla   Neuro: Strength, tone  normal.          Assessment & Plan:  #1 vascular dermatitis secondary to bug bite; rule out component of cellulitis  Plan: See orders and  recommendations

## 2015-08-23 ENCOUNTER — Encounter: Payer: 59 | Admitting: Internal Medicine

## 2015-08-30 ENCOUNTER — Other Ambulatory Visit: Payer: Self-pay | Admitting: Emergency Medicine

## 2015-08-30 ENCOUNTER — Other Ambulatory Visit (INDEPENDENT_AMBULATORY_CARE_PROVIDER_SITE_OTHER): Payer: 59

## 2015-08-30 ENCOUNTER — Ambulatory Visit (INDEPENDENT_AMBULATORY_CARE_PROVIDER_SITE_OTHER): Payer: 59 | Admitting: Internal Medicine

## 2015-08-30 ENCOUNTER — Encounter: Payer: Self-pay | Admitting: Internal Medicine

## 2015-08-30 VITALS — BP 134/74 | HR 64 | Temp 98.1°F | Resp 18 | Ht 68.0 in | Wt 224.0 lb

## 2015-08-30 DIAGNOSIS — Z0189 Encounter for other specified special examinations: Secondary | ICD-10-CM | POA: Diagnosis not present

## 2015-08-30 DIAGNOSIS — Z0000 Encounter for general adult medical examination without abnormal findings: Secondary | ICD-10-CM

## 2015-08-30 DIAGNOSIS — K219 Gastro-esophageal reflux disease without esophagitis: Secondary | ICD-10-CM

## 2015-08-30 LAB — BASIC METABOLIC PANEL
BUN: 14 mg/dL (ref 6–23)
CALCIUM: 9.4 mg/dL (ref 8.4–10.5)
CO2: 29 meq/L (ref 19–32)
Chloride: 104 mEq/L (ref 96–112)
Creatinine, Ser: 0.87 mg/dL (ref 0.40–1.50)
GFR: 99.12 mL/min (ref >60.00)
Glucose, Bld: 99 mg/dL (ref 70–99)
Potassium: 4.3 mEq/L (ref 3.5–5.1)
SODIUM: 138 meq/L (ref 135–145)

## 2015-08-30 LAB — HEPATIC FUNCTION PANEL
ALBUMIN: 4.3 g/dL (ref 3.5–5.2)
ALK PHOS: 69 U/L (ref 39–117)
ALT: 29 U/L (ref 0–53)
AST: 25 U/L (ref 0–37)
Bilirubin, Direct: 0.1 mg/dL (ref 0.0–0.3)
TOTAL PROTEIN: 7.3 g/dL (ref 6.0–8.3)
Total Bilirubin: 0.5 mg/dL (ref 0.2–1.2)

## 2015-08-30 LAB — CBC WITH DIFFERENTIAL/PLATELET
BASOS ABS: 0 10*3/uL (ref 0.0–0.1)
Basophils Relative: 0.4 % (ref 0.0–3.0)
Eosinophils Absolute: 0.2 10*3/uL (ref 0.0–0.7)
Eosinophils Relative: 2.5 % (ref 0.0–5.0)
HEMATOCRIT: 43.7 % (ref 39.0–52.0)
HEMOGLOBIN: 15 g/dL (ref 13.0–17.0)
LYMPHS PCT: 28.4 % (ref 12.0–46.0)
Lymphs Abs: 1.7 10*3/uL (ref 0.7–4.0)
MCHC: 34.4 g/dL (ref 30.0–36.0)
MCV: 90.8 fl (ref 78.0–100.0)
MONOS PCT: 9.8 % (ref 3.0–12.0)
Monocytes Absolute: 0.6 10*3/uL (ref 0.1–1.0)
NEUTROS ABS: 3.6 10*3/uL (ref 1.4–7.7)
Neutrophils Relative %: 58.9 % (ref 43.0–77.0)
Platelets: 250 10*3/uL (ref 150.0–400.0)
RBC: 4.81 Mil/uL (ref 4.22–5.81)
RDW: 13.2 % (ref 11.5–15.5)
WBC: 6.1 10*3/uL (ref 4.0–10.5)

## 2015-08-30 LAB — LIPID PANEL
CHOL/HDL RATIO: 4
Cholesterol: 162 mg/dL (ref 0–200)
HDL: 43.2 mg/dL (ref >39.00)
LDL Cholesterol: 87 mg/dL (ref 0–99)
NONHDL: 119.28
Triglycerides: 162 mg/dL — ABNORMAL HIGH (ref 0.0–149.0)
VLDL: 32.4 mg/dL (ref 0.0–40.0)

## 2015-08-30 LAB — TSH: TSH: 1.74 u[IU]/mL (ref 0.35–4.50)

## 2015-08-30 MED ORDER — SIMVASTATIN 20 MG PO TABS: 20.0000 mg | ORAL_TABLET | Freq: Every day | ORAL | Status: DC

## 2015-08-30 MED ORDER — ESOMEPRAZOLE MAGNESIUM 40 MG PO CPDR: 40.0000 mg | DELAYED_RELEASE_CAPSULE | Freq: Every day | ORAL | Status: DC | PRN

## 2015-08-30 NOTE — Progress Notes (Signed)
   Subjective:    Patient ID: Ryan Reyes, male    DOB: 1966-11-03, 49 y.o.   MRN: [id number]  HPI The patient is here for a physical to assess status of active health conditions.  PMH, FH, & Social History reviewed & updated.No change in [place] as recorded.  He has been compliant with his medications without adverse effects. He does decrease intake of fried foods but will eat red meat and salt. He's only exercising once a week on elliptical for 30 minutes. He has no associated cardiopulmonary symptoms.  He's never smoked. He drinks 6 beers per week.  He is on generic Nexium without active GI symptoms.  He describes occasional edema.  He has nocturia once nightly.  He is intolerant to heat.  Review of Systems  Chest pain, palpitations, tachycardia, exertional dyspnea, paroxysmal nocturnal dyspnea, claudication or edema are absent. No unexplained weight loss, abdominal pain, significant dyspepsia, dysphagia, melena, rectal bleeding, or persistently small caliber stools. Dysuria, pyuria, hematuria, frequency, or polyuria are denied. Change in hair, skin, nails denied. No bowel changes of constipation or diarrhea. No intolerance to cold.     Objective:   Physical Exam  General appearance is one of good health and nourishment w/o distress.  Eyes: No conjunctival inflammation or scleral icterus is present.  Oral exam: Dental hygiene is good; lips and gums are healthy appearing.There is no oropharyngeal erythema or exudate noted.   Heart:  Normal rate and regular rhythm. S1 and S2 normal without gallop, murmur, click, rub or other extra sounds     Lungs:Chest clear to auscultation; no wheezes, rhonchi,rales ,or rubs present.No increased work of breathing.   Abdomen: bowel sounds normal, soft and non-tender without masses, organomegaly or hernias noted.  No guarding or rebound .   Genitourinary exam: Normal genitalia and prostate  Musculoskeletal: Able to lie flat and sit up  without help. Negative straight leg raising bilaterally. Minor crepitus of the knees present. Gait normal  Skin:Warm & dry.  Intact without suspicious lesions or rashes ; no jaundice or tenting  Lymphatic: No lymphadenopathy is noted about the head, neck, axilla, or inguinal areas.      Assessment & Plan:  #1 comprehensive physical exam; no acute findings  Plan: see Orders  & Recommendations

## 2015-08-30 NOTE — Patient Instructions (Signed)
Reflux of gastric acid may be asymptomatic as this may occur mainly during sleep.The triggers for reflux  include stress; the "aspirin family" ; alcohol; peppermint; and caffeine (coffee, tea, cola, and chocolate). The aspirin family would include aspirin and the nonsteroidal agents such as ibuprofen &  Naproxen. Tylenol would not cause reflux. If having symptoms ; food & drink should be avoided for @ least 2 hours before going to bed.   Your next office appointment will be determined based upon review of your pending labs . Those written interpretation of the lab results and instructions will be transmitted to you by My Chart Critical results will be called.  Followup as needed for any active or acute issue. Please report any significant change in your symptoms.

## 2015-08-30 NOTE — Progress Notes (Signed)
Pre visit review using our clinic review tool, if applicable. No additional management support is needed unless otherwise documented below in the visit note. 

## 2015-09-14 ENCOUNTER — Telehealth: Payer: Self-pay | Admitting: Emergency Medicine

## 2015-09-14 NOTE — Telephone Encounter (Signed)
I recommend Dexilant of options open to him

## 2015-09-14 NOTE — Telephone Encounter (Signed)
Request from pharm to change Esomeprazole, pts insurance does not cover. Alt meds include omeprazole, pantoprazole, rabeprazole, or dexilant. Please advise.

## 2015-09-15 NOTE — Telephone Encounter (Signed)
Spoke with pt. Pt stated that he is buying nexium OTC. Pt was instructed to call back if RX strength was needed.

## 2015-10-01 ENCOUNTER — Other Ambulatory Visit: Payer: Self-pay | Admitting: Internal Medicine

## 2015-11-26 ENCOUNTER — Emergency Department (HOSPITAL_COMMUNITY): Payer: 59

## 2015-11-26 ENCOUNTER — Encounter (HOSPITAL_COMMUNITY): Payer: Self-pay | Admitting: Emergency Medicine

## 2015-11-26 ENCOUNTER — Emergency Department (HOSPITAL_COMMUNITY)
Admission: EM | Admit: 2015-11-26 | Discharge: 2015-11-26 | Disposition: A | Discharge status: Home / Self Care | Payer: 59 | Attending: Emergency Medicine | Admitting: Emergency Medicine

## 2015-11-26 DIAGNOSIS — R079 Chest pain, unspecified: Secondary | ICD-10-CM | POA: Diagnosis present

## 2015-11-26 DIAGNOSIS — Z880 Allergy status to penicillin: Secondary | ICD-10-CM | POA: Insufficient documentation

## 2015-11-26 DIAGNOSIS — Z79899 Other long term (current) drug therapy: Secondary | ICD-10-CM | POA: Insufficient documentation

## 2015-11-26 DIAGNOSIS — K219 Gastro-esophageal reflux disease without esophagitis: Secondary | ICD-10-CM | POA: Insufficient documentation

## 2015-11-26 DIAGNOSIS — F419 Anxiety disorder, unspecified: Secondary | ICD-10-CM | POA: Insufficient documentation

## 2015-11-26 DIAGNOSIS — E785 Hyperlipidemia, unspecified: Secondary | ICD-10-CM | POA: Diagnosis not present

## 2015-11-26 LAB — CBC
HCT: 44 % (ref 39.0–52.0)
Hemoglobin: 15.2 g/dL (ref 13.0–17.0)
MCH: 31.9 pg (ref 26.0–34.0)
MCHC: 34.5 g/dL (ref 30.0–36.0)
MCV: 92.4 fL (ref 78.0–100.0)
PLATELETS: 232 10*3/uL (ref 150–400)
RBC: 4.76 MIL/uL (ref 4.22–5.81)
RDW: 12.5 % (ref 11.5–15.5)
WBC: 10.8 10*3/uL — AB (ref 4.0–10.5)

## 2015-11-26 LAB — D-DIMER, QUANTITATIVE: D-Dimer, Quant: <0.27 ug/mL-FEU (ref 0.00–0.50)

## 2015-11-26 LAB — I-STAT TROPONIN, ED
TROPONIN I, POC: 0 ng/mL (ref 0.00–0.08)
Troponin i, poc: 0.01 ng/mL (ref 0.00–0.08)

## 2015-11-26 LAB — BASIC METABOLIC PANEL
Anion gap: 8 (ref 5–15)
BUN: 14 mg/dL (ref 6–20)
CHLORIDE: 108 mmol/L (ref 101–111)
CO2: 25 mmol/L (ref 22–32)
Calcium: 10 mg/dL (ref 8.9–10.3)
Creatinine, Ser: 0.94 mg/dL (ref 0.61–1.24)
GFR calc Af Amer: >60 mL/min (ref >60)
GFR calc non Af Amer: >60 mL/min (ref >60)
GLUCOSE: 116 mg/dL — AB (ref 65–99)
POTASSIUM: 3.9 mmol/L (ref 3.5–5.1)
Sodium: 141 mmol/L (ref 135–145)

## 2015-11-26 LAB — LIPASE, BLOOD: LIPASE: 30 U/L (ref 11–51)

## 2015-11-26 MED ORDER — SODIUM CHLORIDE 0.9 % IV SOLN: 8.0000 mg/h | INTRAVENOUS | Status: DC

## 2015-11-26 MED ORDER — SODIUM CHLORIDE 0.9 % IV SOLN: 80.0000 mg | Freq: Once | INTRAVENOUS | Status: DC

## 2015-11-26 NOTE — ED Notes (Signed)
BP and HR after standing for 3 minutes: BP- 126/80 HR- 82

## 2015-11-26 NOTE — ED Notes (Signed)
EKG completed in triage.

## 2015-11-26 NOTE — ED Notes (Signed)
Provider at bedside

## 2015-11-26 NOTE — ED Notes (Signed)
Onset one day ago chest pain tightness and resolved. Today chest pain returned with tightness left and right chest with stomach uneasy.

## 2015-11-26 NOTE — ED Provider Notes (Signed)
CSN: [id number]     Arrival date & time 11/26/15  [id number] History   First MD Initiated Contact with Patient 11/26/15 [phone]     Chief Complaint  Patient presents with  . Chest Pain    HPI   Mr. Osthoff is an 49 y.o. male with history of HLD and acid reflux who presents to the ED for evaluation of chest pain/chest tightness. He states he was in his usual state of health until last night when he was riding in the car with his son when he started experiencing a bilateral chest tightness with some sharp chest pain. He states that the episode lasted ~1h with waxing and waning symptoms. Denies radiation of the pain. He states that the episode resolved on its own. States when he woke up this AM he started experiencing similar symptoms. He states he drinks "a lot" of water daily and drank 30oz of water this AM with no emesis. He denies SOB but states it feels more like his chest was tight. He also endorses associated lightheadedness and mild nausea, but denies emesis or LOC. He reports he took 81mg  ASA at work prior to ED arrival. States that in the past he has had similar episodes of lightheadedness and nausea, though without chest pain, that were relieved by Nexium. Pt reports resolution of the symptoms now in the ED, though he is still anxious about possible cardiac etiology. Denies diaphoresis, SOB, fever, chills, abdominal pain. Pt is a never smoker, endorses a few beers a week, denies other drugs.    Past Medical History  Diagnosis Date  . Hyperlipidemia   . Esophageal reflux disease   . Family history of ischemic heart disease     father @ 33; CVA pre 68   Past Surgical History  Procedure Laterality Date  . Appendectomy    . Eye surgery      Eye Trauma age 29-6   . Wisdom tooth extraction     Family History  Problem Relation Age of Onset  . Thrombocytopenia Mother     ITP, S/P splenectomy  . Heart attack Father 71  . Stroke Father     < 16  . Melanoma Sister     1/2 sister  . Diabetes  Maternal Grandmother   . Diabetes      2 M 1/2 uncles  . Esophageal cancer      1/2 brother   Social History  Substance Use Topics  . Smoking status: Never Smoker   . Smokeless tobacco: Not on file  . Alcohol Use: 3.6 oz/week    6 Cans of beer per week    Review of Systems  All other systems reviewed and are negative.     Allergies  Aspirin and Penicillins  Home Medications   Prior to Admission medications   Medication Sig Start Date End Date Taking? Authorizing Provider  esomeprazole (NEXIUM) 40 MG capsule Take 1 capsule (40 mg total) by mouth daily as needed. 08/30/15   Hendricks Limes, MD  simvastatin (ZOCOR) 20 MG tablet Take 1 tablet (20 mg total) by mouth at bedtime. 08/30/15   Hendricks Limes, MD   BP 162/89 mmHg  Pulse 98  Temp(Src) 98.4 F (36.9 C) (Oral)  Resp 20  SpO2 99% Physical Exam  Constitutional: He is oriented to person, place, and time. No distress.  HENT:  Right Ear: External ear normal.  Left Ear: External ear normal.  Nose: Nose normal.  Mouth/Throat: Oropharynx is clear and moist. No  oropharyngeal exudate.  Eyes: Conjunctivae and EOM are normal. Pupils are equal, round, and reactive to light. No scleral icterus.  Neck: Normal range of motion. Neck supple.  Cardiovascular: Normal rate, regular rhythm, normal heart sounds and intact distal pulses.   Pulmonary/Chest: Effort normal and breath sounds normal. No respiratory distress. He has no wheezes. He exhibits no tenderness.  Abdominal: Soft. Bowel sounds are normal. He exhibits no distension. There is no tenderness.  Musculoskeletal: Normal range of motion. He exhibits no edema or tenderness.  Neurological: He is alert and oriented to person, place, and time. No cranial nerve deficit. Coordination normal.  Skin: Skin is warm and dry. He is not diaphoretic.  Psychiatric: His mood appears anxious.  Nursing note and vitals reviewed.  Filed Vitals:   11/26/15 1015 11/26/15 1030 11/26/15 1110  11/26/15 1116  BP: 152/87 155/88 139/75   Pulse: 91 91 76   Temp:      TempSrc:      Resp: 15 16 18    Height:    5\' 8"  (1.727 m)  Weight:    95.255 kg  SpO2: 96% 96% 96%      ED Course  Procedures (including critical care time) Labs Review Labs Reviewed  BASIC METABOLIC PANEL - Abnormal; Notable for the following:    Glucose, Bld 116 (*)    All other components within normal limits  CBC - Abnormal; Notable for the following:    WBC 10.8 (*)    All other components within normal limits  LIPASE, BLOOD  I-STAT TROPOININ, ED    Imaging Review Dg Chest 2 View  11/26/2015  CLINICAL DATA:  Chest pain EXAM: CHEST  2 VIEW COMPARISON:  None. FINDINGS: Lungs are clear. Heart size and pulmonary vascularity are normal. No adenopathy. No pneumothorax. No bone lesions. IMPRESSION: No edema or consolidation. Electronically Signed   By: Lowella Grip III M.D.   On: 11/26/2015 10:51   I have personally reviewed and evaluated these images and lab results as part of my medical decision-making.   EKG Interpretation   Date/Time:  Friday November 26 2015 09:55:09 EST Ventricular Rate:  101 PR Interval:  150 QRS Duration: 88 QT Interval:  334 QTC Calculation: 433 R Axis:   43 Text Interpretation:  Sinus tachycardia Otherwise normal ECG No old  tracing to compare Confirmed by FLOYD MD, DANIEL [id number]) on 11/26/2015  10:03:27 AM      MDM   Final diagnoses:  Chest pain, unspecified chest pain type    49 y.o. otherwise healthy male presenting with intermittent chest tightness/pain last night and this AM. Now resolved in the ED. He is slightly hypertensive, HR slightly elevated though I suspect anxiety is playing a large role in his elevated HR. His exam is otherwise unremarkable. Will start with CBC, BMP, trop, EKG, CXR, lipase. Will check orthostatic vitals given report of lightheadedness/queasiness.  Unclear etiology of pt's symptoms. EKG shows sinus tach otherwise unremarkable. CXR  negative. Trop negative. CBC shows slightly elevated white count 10.8. Given HEART score 2 and pt's history, I have very low suspicion that this is ACS. Though HR on EKG 101 in the room down to 76. Discussed with attending Dr. Tyrone Nine as EKG has slight suggestion of right heart strain. However pt has Wells score 1.5, I have low suspicion that this is PE. Will get d-dimer. Will delta trop. If remains negative I anticipate d/c home.    D-dimer negative. Delta trop negative. Discharged home with ER return precautions. Pt  to f/u with PCP.   Anne Ng, PA-C 11/26/15 [place], DO 11/27/15 [phone]

## 2015-11-26 NOTE — Discharge Instructions (Signed)
You were seen in the emergency room today for evaluation of chest pain. Your workup was negative. Please call your primary care provider to schedule a follow-up appointment within one week. Return to the ER for new or worsening symptoms.   Please obtain all of your results from medical records or have your doctors office obtain the results - share them with your doctor - you should be seen at your doctors office in the next 2 days. Call today to arrange your follow up. Take the medications as prescribed. Please review all of the medicines and only take them if you do not have an allergy to them. Please be aware that if you are taking birth control pills, taking other prescriptions, ESPECIALLY ANTIBIOTICS may make the birth control ineffective - if this is the case, either do not engage in sexual activity or use alternative methods of birth control such as condoms until you have finished the medicine and your family doctor says it is OK to restart them. If you are on a blood thinner such as COUMADIN, be aware that any other medicine that you take may cause the coumadin to either work too much, or not enough - you should have your coumadin level rechecked in next 7 days if this is the case.  ?  It is also a possibility that you have an allergic reaction to any of the medicines that you have been prescribed - Everybody reacts differently to medications and while MOST people have no trouble with most medicines, you may have a reaction such as nausea, vomiting, rash, swelling, shortness of breath. If this is the case, please stop taking the medicine immediately and contact your physician.  ?  You should return to the ER if you develop severe or worsening symptoms.    Chest Pain Observation It is often hard to give a specific diagnosis for the cause of chest pain. Among other possibilities your symptoms might be caused by inadequate oxygen delivery to your heart (angina). Angina that is not treated or evaluated  can lead to a heart attack (myocardial infarction) or death. Blood tests, electrocardiograms, and X-rays may have been done to help determine a possible cause of your chest pain. After evaluation and observation, your health care provider has determined that it is unlikely your pain was caused by an unstable condition that requires hospitalization. However, a full evaluation of your pain may need to be completed, with additional diagnostic testing as directed. It is very important to keep your follow-up appointments. Not keeping your follow-up appointments could result in permanent heart damage, disability, or death. If there is any problem keeping your follow-up appointments, you must call your health care provider. HOME CARE INSTRUCTIONS  Due to the slight chance that your pain could be angina, it is important to follow your health care provider's treatment plan and also maintain a healthy lifestyle:  Maintain or work toward achieving a healthy weight.  Stay physically active and exercise regularly.  Decrease your salt intake.  Eat a balanced, healthy diet. Talk to a dietitian to learn about heart-healthy foods.  Increase your fiber intake by including whole grains, vegetables, fruits, and nuts in your diet.  Avoid situations that cause stress, anger, or depression.  Take medicines as advised by your health care provider. Report any side effects to your health care provider. Do not stop medicines or adjust the dosages on your own.  Quit smoking. Do not use nicotine patches or gum until you check with your health  care provider.  Keep your blood pressure, blood sugar, and cholesterol levels within normal limits.  Limit alcohol intake to no more than 1 drink per day for women who are not pregnant and 2 drinks per day for men.  Do not abuse drugs. SEEK IMMEDIATE MEDICAL CARE IF: You have severe chest pain or pressure which may include symptoms such as:  You feel pain or pressure in your  arms, neck, jaw, or back.  You have severe back or abdominal pain, feel sick to your stomach (nauseous), or throw up (vomit).  You are sweating profusely.  You are having a fast or irregular heartbeat.  You feel short of breath while at rest.  You notice increasing shortness of breath during rest, sleep, or with activity.  You have chest pain that does not get better after rest or after taking your usual medicine.  You wake from sleep with chest pain.  You are unable to sleep because you cannot breathe.  You develop a frequent cough or you are coughing up blood.  You feel dizzy, faint, or experience extreme fatigue.  You develop severe weakness, dizziness, fainting, or chills. Any of these symptoms may represent a serious problem that is an emergency. Do not wait to see if the symptoms will go away. Call your local emergency services (911 in the [place] Do not drive yourself to the hospital. MAKE SURE YOU:  Understand these instructions.  Will watch your condition.  Will get help right away if you are not doing well or get worse.   This information is not intended to replace advice given to you by your health care provider. Make sure you discuss any questions you have with your health care provider.   Document Released: 12/30/2010 Document Revised: 12/02/2013 Document Reviewed: 05/29/2013 Elsevier Interactive Patient Education [employer].

## 2015-12-03 ENCOUNTER — Other Ambulatory Visit (INDEPENDENT_AMBULATORY_CARE_PROVIDER_SITE_OTHER): Payer: 59

## 2015-12-03 ENCOUNTER — Ambulatory Visit (INDEPENDENT_AMBULATORY_CARE_PROVIDER_SITE_OTHER): Payer: 59 | Admitting: Internal Medicine

## 2015-12-03 ENCOUNTER — Encounter: Payer: Self-pay | Admitting: Internal Medicine

## 2015-12-03 VITALS — BP 132/86 | HR 70 | Temp 98.0°F | Resp 16 | Wt 218.0 lb

## 2015-12-03 DIAGNOSIS — K2100 Gastro-esophageal reflux disease with esophagitis, without bleeding: Secondary | ICD-10-CM

## 2015-12-03 DIAGNOSIS — E781 Pure hyperglyceridemia: Secondary | ICD-10-CM | POA: Diagnosis not present

## 2015-12-03 DIAGNOSIS — R0789 Other chest pain: Secondary | ICD-10-CM

## 2015-12-03 DIAGNOSIS — R739 Hyperglycemia, unspecified: Secondary | ICD-10-CM

## 2015-12-03 LAB — HEMOGLOBIN A1C: Hgb A1c MFr Bld: 5.1 % (ref 4.6–6.5)

## 2015-12-03 NOTE — Patient Instructions (Signed)
The Stress Test referral will be scheduled and you'll be notified of the time.Please call the Referral Co-Ordinator @ [phone] if you have not been notified of appointment time within 7-10 days. Reflux of gastric acid may be asymptomatic as this may occur mainly during sleep.The triggers for reflux  include stress; the "aspirin family" ; alcohol; peppermint; and caffeine (coffee, tea, cola, and chocolate). The aspirin family would include aspirin and the nonsteroidal agents such as ibuprofen &  Naproxen. Tylenol would not cause reflux. If having symptoms ; food & drink should be avoided for @ least 2 hours before going to bed. Please take enteric-coated aspirin 81 mg daily with breakfast.

## 2015-12-03 NOTE — Progress Notes (Signed)
Pre visit review using our clinic review tool, if applicable. No additional management support is needed unless otherwise documented below in the visit note. 

## 2015-12-03 NOTE — Progress Notes (Signed)
   Subjective:    Patient ID: Ryan Reyes, male    DOB: 04/28/1966, 49 y.o.   MRN: [id number]  HPI He is here in follow-up from the emergency room visit 12/16. He began to have chest tightness on 11/25/15 at rest. The morning of 12/16 it recurred driving to work and persisted prompting the ER visit. Lipase, troponin, and d-dimer were all normal. White count was mildly elevated but there was no anemia. The chemistries were otherwise normal except for glucose of 116.  He  does have 2-3 caffeinated beverages daily. He will have 3-6  beers per week. He does take 81 mg of aspirin each evening.  He's been compliant with his simvastatin; but he had been taking the Nexium only as needed prior  to the emergency room visit.  He has decreased his fried food intake. He does not add salt. He will have red meat 3 times a week. He typically will be on the elliptical for 30 minutes 2-3 times per week without associated cardio pulmonary symptoms  Review of systems is negative except for nocturia at least once nightly and intolerance to heat.TSH was therapeutic in September.  Lipids were at goal in September except for mildly elevated triglycerides.   Review of Systems Palpitations, tachycardia, exertional dyspnea, paroxysmal nocturnal dyspnea, claudication or edema are absent since the ER visit. No unexplained weight loss, abdominal pain, significant dyspepsia, dysphagia, melena, rectal bleeding, or persistently small caliber stools. Dysuria, pyuria, hematuria, frequency,  polyuria are denied. Change in hair, skin, nails denied. No bowel changes of constipation or diarrhea. No intolerance to cold.     Objective:   Physical Exam  Pertinent or positive findings include:he has minor crepitus in the knees. Homans sign is negative bilaterally.  General appearance :adequately nourished; in no distress.  Eyes: No conjunctival inflammation or scleral icterus is present.  Oral exam:  Lips and gums are  healthy appearing.There is no oropharyngeal erythema or exudate noted. Dental hygiene is good.  Heart:  Normal rate and regular rhythm. S1 and S2 normal without gallop, murmur, click, rub or other extra sounds    Lungs:Chest clear to auscultation; no wheezes, rhonchi,rales ,or rubs present.No increased work of breathing.   Abdomen: bowel sounds normal, soft and non-tender without masses, organomegaly or hernias noted.  No guarding or rebound.   Vascular : all pulses equal ; no bruits present.  Skin:Warm & dry.  Intact without suspicious lesions or rashes ; no tenting or jaundice   Lymphatic: No lymphadenopathy is noted about the head, neck, axilla.   Neuro: Strength, tone & DTRs normal.     Assessment & Plan:  #1chest pain at rest  #2 reflux  #3 hyperglycemia  #4 hypertriglyceridemia.  Plan: See orders and recommendations

## 2015-12-10 ENCOUNTER — Encounter (HOSPITAL_COMMUNITY): Payer: 59

## 2015-12-23 ENCOUNTER — Telehealth (HOSPITAL_COMMUNITY): Payer: Self-pay

## 2015-12-23 NOTE — Telephone Encounter (Signed)
Encounter complete. 

## 2015-12-24 ENCOUNTER — Telehealth (HOSPITAL_COMMUNITY): Payer: Self-pay

## 2015-12-24 NOTE — Telephone Encounter (Signed)
Encounter complete. 

## 2015-12-28 ENCOUNTER — Ambulatory Visit (HOSPITAL_COMMUNITY)
Admission: RE | Admit: 2015-12-28 | Discharge: 2015-12-28 | Disposition: A | Discharge status: Home / Self Care | Payer: 59 | Source: Ambulatory Visit | Attending: Cardiovascular Disease | Admitting: Cardiovascular Disease

## 2015-12-28 DIAGNOSIS — R0789 Other chest pain: Secondary | ICD-10-CM

## 2015-12-28 LAB — EXERCISE TOLERANCE TEST
CSEPED: 12 min
CSEPPHR: 173 {beats}/min
Estimated workload: 13.7 METS
MPHR: 171 {beats}/min
Percent HR: 101 %
RPE: 16
Rest HR: 77 {beats}/min

## 2016-08-30 ENCOUNTER — Encounter: Payer: Self-pay | Admitting: Internal Medicine

## 2016-08-30 ENCOUNTER — Other Ambulatory Visit (INDEPENDENT_AMBULATORY_CARE_PROVIDER_SITE_OTHER): Payer: 59

## 2016-08-30 ENCOUNTER — Ambulatory Visit (INDEPENDENT_AMBULATORY_CARE_PROVIDER_SITE_OTHER): Payer: 59 | Admitting: Internal Medicine

## 2016-08-30 VITALS — BP 124/86 | HR 73 | Temp 98.1°F | Resp 16 | Ht 68.0 in | Wt 212.0 lb

## 2016-08-30 DIAGNOSIS — E782 Mixed hyperlipidemia: Secondary | ICD-10-CM | POA: Diagnosis not present

## 2016-08-30 DIAGNOSIS — G4762 Sleep related leg cramps: Secondary | ICD-10-CM

## 2016-08-30 DIAGNOSIS — Z0000 Encounter for general adult medical examination without abnormal findings: Secondary | ICD-10-CM | POA: Diagnosis not present

## 2016-08-30 DIAGNOSIS — M545 Low back pain, unspecified: Secondary | ICD-10-CM

## 2016-08-30 DIAGNOSIS — Z23 Encounter for immunization: Secondary | ICD-10-CM

## 2016-08-30 DIAGNOSIS — K219 Gastro-esophageal reflux disease without esophagitis: Secondary | ICD-10-CM | POA: Diagnosis not present

## 2016-08-30 DIAGNOSIS — Z114 Encounter for screening for human immunodeficiency virus [HIV]: Secondary | ICD-10-CM

## 2016-08-30 DIAGNOSIS — Z1211 Encounter for screening for malignant neoplasm of colon: Secondary | ICD-10-CM

## 2016-08-30 LAB — COMPREHENSIVE METABOLIC PANEL
ALK PHOS: 75 U/L (ref 39–117)
ALT: 22 U/L (ref 0–53)
AST: 22 U/L (ref 0–37)
Albumin: 4.5 g/dL (ref 3.5–5.2)
BILIRUBIN TOTAL: 0.7 mg/dL (ref 0.2–1.2)
BUN: 18 mg/dL (ref 6–23)
CALCIUM: 9.6 mg/dL (ref 8.4–10.5)
CO2: 29 mEq/L (ref 19–32)
Chloride: 103 mEq/L (ref 96–112)
Creatinine, Ser: 1.02 mg/dL (ref 0.40–1.50)
GFR: 82.16 mL/min (ref >60.00)
Glucose, Bld: 102 mg/dL — ABNORMAL HIGH (ref 70–99)
Potassium: 4.6 mEq/L (ref 3.5–5.1)
Sodium: 139 mEq/L (ref 135–145)
TOTAL PROTEIN: 7.5 g/dL (ref 6.0–8.3)

## 2016-08-30 LAB — CBC WITH DIFFERENTIAL/PLATELET
BASOS ABS: 0 10*3/uL (ref 0.0–0.1)
Basophils Relative: 0.3 % (ref 0.0–3.0)
Eosinophils Absolute: 0.1 10*3/uL (ref 0.0–0.7)
Eosinophils Relative: 1.5 % (ref 0.0–5.0)
HCT: 44.1 % (ref 39.0–52.0)
Hemoglobin: 15.4 g/dL (ref 13.0–17.0)
LYMPHS ABS: 1.3 10*3/uL (ref 0.7–4.0)
Lymphocytes Relative: 21.3 % (ref 12.0–46.0)
MCHC: 34.8 g/dL (ref 30.0–36.0)
MCV: 90.4 fl (ref 78.0–100.0)
MONO ABS: 0.5 10*3/uL (ref 0.1–1.0)
MONOS PCT: 8.5 % (ref 3.0–12.0)
NEUTROS ABS: 4.2 10*3/uL (ref 1.4–7.7)
NEUTROS PCT: 68.4 % (ref 43.0–77.0)
PLATELETS: 274 10*3/uL (ref 150.0–400.0)
RBC: 4.87 Mil/uL (ref 4.22–5.81)
RDW: 13.6 % (ref 11.5–15.5)
WBC: 6.1 10*3/uL (ref 4.0–10.5)

## 2016-08-30 LAB — LIPID PANEL
Cholesterol: 158 mg/dL (ref 0–200)
HDL: 47.8 mg/dL (ref >39.00)
LDL Cholesterol: 89 mg/dL (ref 0–99)
NONHDL: 109.78
TRIGLYCERIDES: 104 mg/dL (ref 0.0–149.0)
Total CHOL/HDL Ratio: 3
VLDL: 20.8 mg/dL (ref 0.0–40.0)

## 2016-08-30 LAB — HIV ANTIBODY (ROUTINE TESTING W REFLEX): HIV: NONREACTIVE

## 2016-08-30 LAB — TSH: TSH: 2.25 u[IU]/mL (ref 0.35–4.50)

## 2016-08-30 LAB — MAGNESIUM: Magnesium: 2 mg/dL (ref 1.5–2.5)

## 2016-08-30 MED ORDER — SIMVASTATIN 20 MG PO TABS: 20.0000 mg | ORAL_TABLET | Freq: Every day | ORAL | 3 refills | Status: DC

## 2016-08-30 NOTE — Patient Instructions (Addendum)
Test(s) ordered today. Your results will be released to [place] (or called to you) after review, usually within 72hours after test completion. If any changes need to be made, you will be notified at that same time.  All other Health Maintenance issues reviewed.   All recommended immunizations and age-appropriate screenings are up-to-date or discussed.  Flu vaccine administered today.   Medications reviewed and updated.  No changes recommended at this time.  Your prescription(s) have been submitted to your pharmacy. Please take as directed and contact our office if you believe you are having problem(s) with the medication(s).  A referral was ordered GI for colonoscopy   Please followup in one year    Health Maintenance, Male A healthy lifestyle and preventative care can promote health and wellness.  Maintain regular health, dental, and eye exams.  Eat a healthy diet. Foods like vegetables, fruits, whole grains, low-fat dairy products, and lean protein foods contain the nutrients you need and are low in calories. Decrease your intake of foods high in solid fats, added sugars, and salt. Get information about a proper diet from your health care provider, if necessary.  Regular physical exercise is one of the most important things you can do for your health. Most adults should get at least 150 minutes of moderate-intensity exercise (any activity that increases your heart rate and causes you to sweat) each week. In addition, most adults need muscle-strengthening exercises on 2 or more days a week.   Maintain a healthy weight. The body mass index (BMI) is a screening tool to identify possible weight problems. It provides an estimate of body fat based on height and weight. Your health care provider can find your BMI and can help you achieve or maintain a healthy weight. For males 20 years and older:  A BMI below 18.5 is considered underweight.  A BMI of 18.5 to 24.9 is normal.  A BMI of 25  to 29.9 is considered overweight.  A BMI of 30 and above is considered obese.  Maintain normal blood lipids and cholesterol by exercising and minimizing your intake of saturated fat. Eat a balanced diet with plenty of fruits and vegetables. Blood tests for lipids and cholesterol should begin at age 6 and be repeated every 5 years. If your lipid or cholesterol levels are high, you are over age 57, or you are at high risk for heart disease, you may need your cholesterol levels checked more frequently.Ongoing high lipid and cholesterol levels should be treated with medicines if diet and exercise are not working.  If you smoke, find out from your health care provider how to quit. If you do not use tobacco, do not start.  Lung cancer screening is recommended for adults aged 71-80 years who are at high risk for developing lung cancer because of a history of smoking. A yearly low-dose CT scan of the lungs is recommended for people who have at least a 30-pack-year history of smoking and are current smokers or have quit within the past 15 years. A pack year of smoking is smoking an average of 1 pack of cigarettes a day for 1 year (for example, a 30-pack-year history of smoking could mean smoking 1 pack a day for 30 years or 2 packs a day for 15 years). Yearly screening should continue until the smoker has stopped smoking for at least 15 years. Yearly screening should be stopped for people who develop a health problem that would prevent them from having lung cancer treatment.  If  you choose to drink alcohol, do not have more than 2 drinks per day. One drink is considered to be 12 oz (360 mL) of beer, 5 oz (150 mL) of wine, or 1.5 oz (45 mL) of liquor.  Avoid the use of street drugs. Do not share needles with anyone. Ask for help if you need support or instructions about stopping the use of drugs.  High blood pressure causes heart disease and increases the risk of stroke. High blood pressure is more likely to  develop in:  People who have blood pressure in the end of the normal range (100-139/85-89 mm Hg).  People who are overweight or obese.  People who are African American.  If you are 68-19 years of age, have your blood pressure checked every 3-5 years. If you are 62 years of age or older, have your blood pressure checked every year. You should have your blood pressure measured twice--once when you are at a hospital or clinic, and once when you are not at a hospital or clinic. Record the average of the two measurements. To check your blood pressure when you are not at a hospital or clinic, you can use:  An automated blood pressure machine at a pharmacy.  A home blood pressure monitor.  If you are 50-38 years old, ask your health care provider if you should take aspirin to prevent heart disease.  Diabetes screening involves taking a blood sample to check your fasting blood sugar level. This should be done once every 3 years after age 14 if you are at a normal weight and without risk factors for diabetes. Testing should be considered at a younger age or be carried out more frequently if you are overweight and have at least 1 risk factor for diabetes.  Colorectal cancer can be detected and often prevented. Most routine colorectal cancer screening begins at the age of 7 and continues through age 39. However, your health care provider may recommend screening at an earlier age if you have risk factors for colon cancer. On a yearly basis, your health care provider may provide home test kits to check for hidden blood in the stool. A small camera at the end of a tube may be used to directly examine the colon (sigmoidoscopy or colonoscopy) to detect the earliest forms of colorectal cancer. Talk to your health care provider about this at age 77 when routine screening begins. A direct exam of the colon should be repeated every 5-10 years through age 12, unless early forms of precancerous polyps or small growths  are found.  People who are at an increased risk for hepatitis B should be screened for this virus. You are considered at high risk for hepatitis B if:  You were born in a country where hepatitis B occurs often. Talk with your health care provider about which countries are considered high risk.  Your parents were born in a high-risk country and you have not received a shot to protect against hepatitis B (hepatitis B vaccine).  You have HIV or AIDS.  You use needles to inject street drugs.  You live with, or have sex with, someone who has hepatitis B.  You are a man who has sex with other men (MSM).  You get hemodialysis treatment.  You take certain medicines for conditions like cancer, organ transplantation, and autoimmune conditions.  Hepatitis C blood testing is recommended for all people born from [birth year] through 1965 and any individual with known risk factors for hepatitis C.  Healthy men should no longer receive prostate-specific antigen (PSA) blood tests as part of routine cancer screening. Talk to your health care provider about prostate cancer screening.  Testicular cancer screening is not recommended for adolescents or adult males who have no symptoms. Screening includes self-exam, a health care provider exam, and other screening tests. Consult with your health care provider about any symptoms you have or any concerns you have about testicular cancer.  Practice safe sex. Use condoms and avoid high-risk sexual practices to reduce the spread of sexually transmitted infections (STIs).  You should be screened for STIs, including gonorrhea and chlamydia if:  You are sexually active and are younger than 24 years.  You are older than 24 years, and your health care provider tells you that you are at risk for this type of infection.  Your sexual activity has changed since you were last screened, and you are at an increased risk for chlamydia or gonorrhea. Ask your health care provider  if you are at risk.  If you are at risk of being infected with HIV, it is recommended that you take a prescription medicine daily to prevent HIV infection. This is called pre-exposure prophylaxis (PrEP). You are considered at risk if:  You are a man who has sex with other men (MSM).  You are a heterosexual man who is sexually active with multiple partners.  You take drugs by injection.  You are sexually active with a partner who has HIV.  Talk with your health care provider about whether you are at high risk of being infected with HIV. If you choose to begin PrEP, you should first be tested for HIV. You should then be tested every 3 months for as long as you are taking PrEP.  Use sunscreen. Apply sunscreen liberally and repeatedly throughout the day. You should seek shade when your shadow is shorter than you. Protect yourself by wearing long sleeves, pants, a wide-brimmed hat, and sunglasses year round whenever you are outdoors.  Tell your health care provider of new moles or changes in moles, especially if there is a change in shape or color. Also, tell your health care provider if a mole is larger than the size of a pencil eraser.  A one-time screening for abdominal aortic aneurysm (AAA) and surgical repair of large AAAs by ultrasound is recommended for men aged 54-75 years who are current or former smokers.  Stay current with your vaccines (immunizations).   This information is not intended to replace advice given to you by your health care provider. Make sure you discuss any questions you have with your health care provider.   Document Released: 05/25/2008 Document Revised: 12/18/2014 Document Reviewed: 04/24/2011 Elsevier Interactive Patient Education [employer].

## 2016-08-30 NOTE — Progress Notes (Signed)
Pre visit review using our clinic review tool, if applicable. No additional management support is needed unless otherwise documented below in the visit note. 

## 2016-08-30 NOTE — Assessment & Plan Note (Signed)
Drinks a lot of water Taking a MVI Check electrolytes Consider adding CO Q10 Stretch legs

## 2016-08-30 NOTE — Assessment & Plan Note (Signed)
Mild in nature Consider nsaids and possible muscle relaxer Continue regular exercise

## 2016-08-30 NOTE — Assessment & Plan Note (Signed)
Taking nexium 20 mg daily GERD controlled Continue daily medication

## 2016-08-30 NOTE — Progress Notes (Signed)
Subjective:    Patient ID: Ryan Reyes, male    DOB: Apr 15, 1966, 50 y.o.   MRN: [id number]  HPI He is here to establish with a new pcp.   He is here for a physical exam.   He is getting more cramps in his legs more often.  They usual occur at night.  It occurs in his lower legs.  It tends to occur in his calves.   He injured his back over the weekend. He lifted something off his car.  He has stiffness in his back - central to left lower back.  He has strained his back in the past. He denies numbness/tingling or pain in the legs.    Medications and allergies reviewed with patient and updated if appropriate.  Patient Active Problem List   Diagnosis Date Noted  . GERD (gastroesophageal reflux disease) 08/30/2016  . History of Legg-Calve-Perthes disease 08/18/2013  . HYPERLIPIDEMIA 09/06/2007    Current Outpatient Prescriptions on File Prior to Visit  Medication Sig Dispense Refill  . aspirin EC 81 MG tablet Take 81 mg by mouth daily.    . Multiple Vitamin (MULTIVITAMIN WITH MINERALS) TABS tablet Take 1 tablet by mouth daily.     No current facility-administered medications on file prior to visit.     Past Medical History:  Diagnosis Date  . Esophageal reflux disease   . Family history of ischemic heart disease    father @ 70; CVA pre 40  . Hyperlipidemia     Past Surgical History:  Procedure Laterality Date  . APPENDECTOMY    . EYE SURGERY     Eye Trauma age 53-6   . WISDOM TOOTH EXTRACTION      Social History   Social History  . Marital status: Married    Spouse name: N/A  . Number of children: N/A  . Years of education: N/A   Social History Main Topics  . Smoking status: Never Smoker  . Smokeless tobacco: None  . Alcohol use 3.6 oz/week    6 Cans of beer per week  . Drug use: No  . Sexual activity: Not Asked   Other Topics Concern  . None   Social History Narrative   Elliptical - 2-4 times a week, golf    Family History  Problem Relation Age of  Onset  . Thrombocytopenia Mother     ITP, S/P splenectomy  . Heart attack Father 30  . Stroke Father     < 31  . Melanoma Sister     1/2 sister  . Diabetes Maternal Grandmother   . Diabetes      2 M 1/2 uncles  . Esophageal cancer      1/2 brother    Review of Systems  Constitutional: Negative for appetite change, chills, fatigue, fever and unexpected weight change.  HENT: Negative for hearing loss.   Eyes: Negative for visual disturbance.  Respiratory: Negative for cough, shortness of breath and wheezing.   Cardiovascular: Negative for chest pain, palpitations and leg swelling.  Gastrointestinal: Negative for abdominal pain, blood in stool, constipation, diarrhea and nausea.  Genitourinary: Negative for difficulty urinating, dysuria and hematuria.  Musculoskeletal: Positive for back pain.  Skin: Negative for color change and rash.  Neurological: Negative for dizziness, weakness, light-headedness, numbness and headaches.  Psychiatric/Behavioral: Negative for dysphoric mood. The patient is not nervous/anxious.        Objective:   Vitals:   08/30/16 0757  BP: 124/86  Pulse: 73  Resp: 16  Temp: 98.1 F (36.7 C)   Filed Weights   08/30/16 0757  Weight: 212 lb (96.2 kg)   Body mass index is 32.23 kg/m.   Physical Exam Constitutional: He appears well-developed and well-nourished. No distress.  HENT:  Head: Normocephalic and atraumatic.  Right Ear: External ear normal.  Left Ear: External ear normal.  Mouth/Throat: Oropharynx is clear and moist.  Normal ear canals and TM b/l  Eyes: Conjunctivae and EOM are normal.  Neck: Neck supple. No tracheal deviation present. No thyromegaly present.  No carotid bruit  Cardiovascular: Normal rate, regular rhythm, normal heart sounds and intact distal pulses.   No murmur heard. Pulmonary/Chest: Effort normal and breath sounds normal. No respiratory distress. He has no wheezes. He has no rales.  Abdominal: Soft. Bowel sounds  are normal. He exhibits no distension. There is no tenderness.  Genitourinary:   normal prostate size without nodules Musculoskeletal: He exhibits no edema.  Lymphadenopathy:    He has no cervical adenopathy.  Skin: Skin is warm and dry. He is not diaphoretic.  Psychiatric: He has a normal mood and affect. His behavior is normal.        Assessment & Plan:   Physical exam: Screening blood work ordered Immunizations  Flu vaccine, td up to date Colonoscopy  Referred today Eye exams  Done yesterday EKG done last year - normal, also had stress test Exercise - regular Weight - BMI > 30 - advised weight loss Skin no concerns, sees derm annually given fam hx of melanoma Substance abuse  none   See Problem List for Assessment and Plan of chronic medical problems.

## 2016-08-30 NOTE — Assessment & Plan Note (Signed)
Check lipid panel, cmp Continue statin at same dose

## 2016-08-31 ENCOUNTER — Encounter: Payer: Self-pay | Admitting: Internal Medicine

## 2016-08-31 LAB — PSA, TOTAL AND FREE
PROSTATE SPECIFIC AG, SERUM: 1.4 ng/mL (ref 0.0–4.0)
PSA FREE: 0.33 ng/mL
PSA, Free Pct: 23.6 %

## 2016-09-13 ENCOUNTER — Encounter: Payer: Self-pay | Admitting: Gastroenterology

## 2016-11-06 ENCOUNTER — Ambulatory Visit (AMBULATORY_SURGERY_CENTER): Payer: Self-pay | Admitting: *Deleted

## 2016-11-06 VITALS — Ht 68.0 in | Wt 216.0 lb

## 2016-11-06 DIAGNOSIS — Z1211 Encounter for screening for malignant neoplasm of colon: Secondary | ICD-10-CM

## 2016-11-06 MED ORDER — NA SULFATE-K SULFATE-MG SULF 17.5-3.13-1.6 GM/177ML PO SOLN: 1.0000 | Freq: Once | ORAL | 0 refills | Status: AC

## 2016-11-06 NOTE — Progress Notes (Signed)
No egg or soy allergy known to patient  No issues with past sedation with any surgeries  or procedures, no intubation problems  No diet pills per patient No home 02 use per patient  No blood thinners per patient  Pt denies issues with constipation  No A fib or A flutter  emmi video to e mail    

## 2016-11-13 ENCOUNTER — Encounter: Payer: Self-pay | Admitting: Gastroenterology

## 2016-11-20 ENCOUNTER — Encounter: Payer: Self-pay | Admitting: Gastroenterology

## 2016-11-20 ENCOUNTER — Ambulatory Visit (AMBULATORY_SURGERY_CENTER): Payer: 59 | Admitting: Gastroenterology

## 2016-11-20 VITALS — BP 118/72 | HR 66 | Temp 98.6°F | Resp 18 | Ht 68.0 in | Wt 212.0 lb

## 2016-11-20 DIAGNOSIS — Z1212 Encounter for screening for malignant neoplasm of rectum: Secondary | ICD-10-CM | POA: Diagnosis not present

## 2016-11-20 DIAGNOSIS — Z1211 Encounter for screening for malignant neoplasm of colon: Secondary | ICD-10-CM

## 2016-11-20 DIAGNOSIS — D123 Benign neoplasm of transverse colon: Secondary | ICD-10-CM | POA: Diagnosis not present

## 2016-11-20 MED ORDER — SODIUM CHLORIDE 0.9 % IV SOLN: 500.0000 mL | INTRAVENOUS | Status: DC

## 2016-11-20 NOTE — Patient Instructions (Signed)
NO NSAIDS (MOTRIN, ADVIL,ALEVE, NAPROSYN, ETC) FOR TWO WEEKS, 12-04-16.    YOU HAD AN ENDOSCOPIC PROCEDURE TODAY AT [facility]:   Refer to the procedure report that was given to you for any specific questions about what was found during the examination.  If the procedure report does not answer your questions, please call your gastroenterologist to clarify.  If you requested that your care partner not be given the details of your procedure findings, then the procedure report has been included in a sealed envelope for you to review at your convenience later.  YOU SHOULD EXPECT: Some feelings of bloating in the abdomen. Passage of more gas than usual.  Walking can help get rid of the air that was put into your GI tract during the procedure and reduce the bloating. If you had a lower endoscopy (such as a colonoscopy or flexible sigmoidoscopy) you may notice spotting of blood in your stool or on the toilet paper. If you underwent a bowel prep for your procedure, you may not have a normal bowel movement for a few days.  Please Note:  You might notice some irritation and congestion in your nose or some drainage.  This is from the oxygen used during your procedure.  There is no need for concern and it should clear up in a day or so.  SYMPTOMS TO REPORT IMMEDIATELY:   Following lower endoscopy (colonoscopy or flexible sigmoidoscopy):  Excessive amounts of blood in the stool  Significant tenderness or worsening of abdominal pains  Swelling of the abdomen that is new, acute  Fever of 100F or higher   For urgent or emergent issues, a gastroenterologist can be reached at any hour by calling [phone].   DIET:  We do recommend a small meal at first, but then you may proceed to your regular diet.  Drink plenty of fluids but you should avoid alcoholic beverages for 24 hours.  ACTIVITY:  You should plan to take it easy for the rest of today and you should NOT DRIVE or use heavy  machinery until tomorrow (because of the sedation medicines used during the test).    FOLLOW UP: Our staff will call the number listed on your records the next business day following your procedure to check on you and address any questions or concerns that you may have regarding the information given to you following your procedure. If we do not reach you, we will leave a message.  However, if you are feeling well and you are not experiencing any problems, there is no need to return our call.  We will assume that you have returned to your regular daily activities without incident.  If any biopsies were taken you will be contacted by phone or by letter within the next 1-3 weeks.  Please call us at [phone] if you have not heard about the biopsies in 3 weeks.    SIGNATURES/CONFIDENTIALITY: You and/or your care partner have signed paperwork which will be entered into your electronic medical record.  These signatures attest to the fact that that the information above on your After Visit Summary has been reviewed and is understood.  Full responsibility of the confidentiality of this discharge information lies with you and/or your care-partner.

## 2016-11-20 NOTE — Op Note (Signed)
[place] Patient Name: Ryan Reyes Procedure Date: 11/20/2016 8:32 AM MRN: [id number] Endoscopist: Remo Lipps P. Armbruster MD, MD Age: 50 Referring MD:  Date of Birth: Dec 20, 1965 Gender: Male Account #: [id number] Procedure:                Colonoscopy Indications:              Screening for malignant neoplasm in the colon, This                            is the patient's first colonoscopy Medicines:                Monitored Anesthesia Care Procedure:                Pre-Anesthesia Assessment:                           - Prior to the procedure, a History and Physical                            was performed, and patient medications and                            allergies were reviewed. The patient's tolerance of                            previous anesthesia was also reviewed. The risks                            and benefits of the procedure and the sedation                            options and risks were discussed with the patient.                            All questions were answered, and informed consent                            was obtained. Prior Anticoagulants: The patient has                            taken aspirin, last dose was 1 day prior to                            procedure. ASA Grade Assessment: II - A patient                            with mild systemic disease. After reviewing the                            risks and benefits, the patient was deemed in                            satisfactory condition to undergo the procedure.  After obtaining informed consent, the colonoscope                            was passed under direct vision. Throughout the                            procedure, the patient's blood pressure, pulse, and                            oxygen saturations were monitored continuously. The                            Model CF-H180AL [id number]) scope was introduced                            through the anus  and advanced to the the terminal                            ileum, with identification of the appendiceal                            orifice and IC valve. The colonoscopy was performed                            without difficulty. The patient tolerated the                            procedure well. The quality of the bowel                            preparation was good. The terminal ileum, ileocecal                            valve, appendiceal orifice, and rectum were                            photographed. Scope In: 8:40:00 AM Scope Out: 8:56:23 AM Scope Withdrawal Time: 0 hours 13 minutes 41 seconds  Total Procedure Duration: 0 hours 16 minutes 23 seconds  Findings:                 The perianal and digital rectal examinations were                            normal.                           A 5 mm polyp was found in the hepatic flexure. The                            polyp was flat. The polyp was removed with a cold                            snare. Resection and retrieval were complete.  Two flat polyps were found in the transverse colon.                            The polyps were diminutive in size. These polyps                            were removed with a cold snare. Resection and                            retrieval were complete.                           Internal hemorrhoids were found during retroflexion.                           The terminal ileum appeared normal.                           The exam was otherwise without abnormality. Complications:            No immediate complications. Estimated blood loss:                            Minimal. Estimated Blood Loss:     Estimated blood loss was minimal. Impression:               - One 5 mm polyp at the hepatic flexure, removed                            with a cold snare. Resected and retrieved.                           - Two diminutive polyps in the transverse colon,                             removed with a cold snare. Resected and retrieved.                           - Internal hemorrhoids.                           - The examined portion of the ileum was normal.                           - The examination was otherwise normal. Recommendation:           - Patient has a contact number available for                            emergencies. The signs and symptoms of potential                            delayed complications were discussed with the  patient. Return to normal activities tomorrow.                            Written discharge instructions were provided to the                            patient.                           - Resume previous diet.                           - Continue present medications.                           - No ibuprofen, naproxen, or other non-steroidal                            anti-inflammatory drugs for 2 weeks after polyp                            removal.                           - Await pathology results.                           - Repeat colonoscopy is recommended for                            surveillance. The colonoscopy date will be                            determined after pathology results from today's                            exam become available for review. Remo Lipps P. Armbruster MD, MD 11/20/2016 9:00:49 AM This report has been signed electronically.

## 2016-11-20 NOTE — Progress Notes (Signed)
Report given to PACU RN, vss 

## 2016-11-20 NOTE — Progress Notes (Signed)
Called to room to assist during endoscopic procedure.  Patient ID and intended procedure confirmed with present staff. Received instructions for my participation in the procedure from the performing physician.  

## 2016-11-21 ENCOUNTER — Telehealth: Payer: Self-pay

## 2016-11-21 NOTE — Telephone Encounter (Signed)
  Follow up Call-  Call back number 11/20/2016  Post procedure Call Back phone  # [phone]  Permission to leave phone message Yes  Some recent data might be hidden     Patient questions:  Do you have a fever, pain , or abdominal swelling? No. Pain Score  0 *  Have you tolerated food without any problems? Yes.    Have you been able to return to your normal activities? Yes.    Do you have any questions about your discharge instructions: Diet   No. Medications  No. Follow up visit  No.  Do you have questions or concerns about your Care? No.  Actions: * If pain score is 4 or above: No action needed, pain <4.

## 2016-11-27 ENCOUNTER — Encounter: Payer: Self-pay | Admitting: Gastroenterology

## 2017-04-05 IMAGING — CR DG CHEST 2V
2 series · 2 of 2 positions shown · non-contrast
Comparison: None.

CLINICAL DATA: Chest pain

EXAM:
CHEST  2 VIEW

[chest pa]
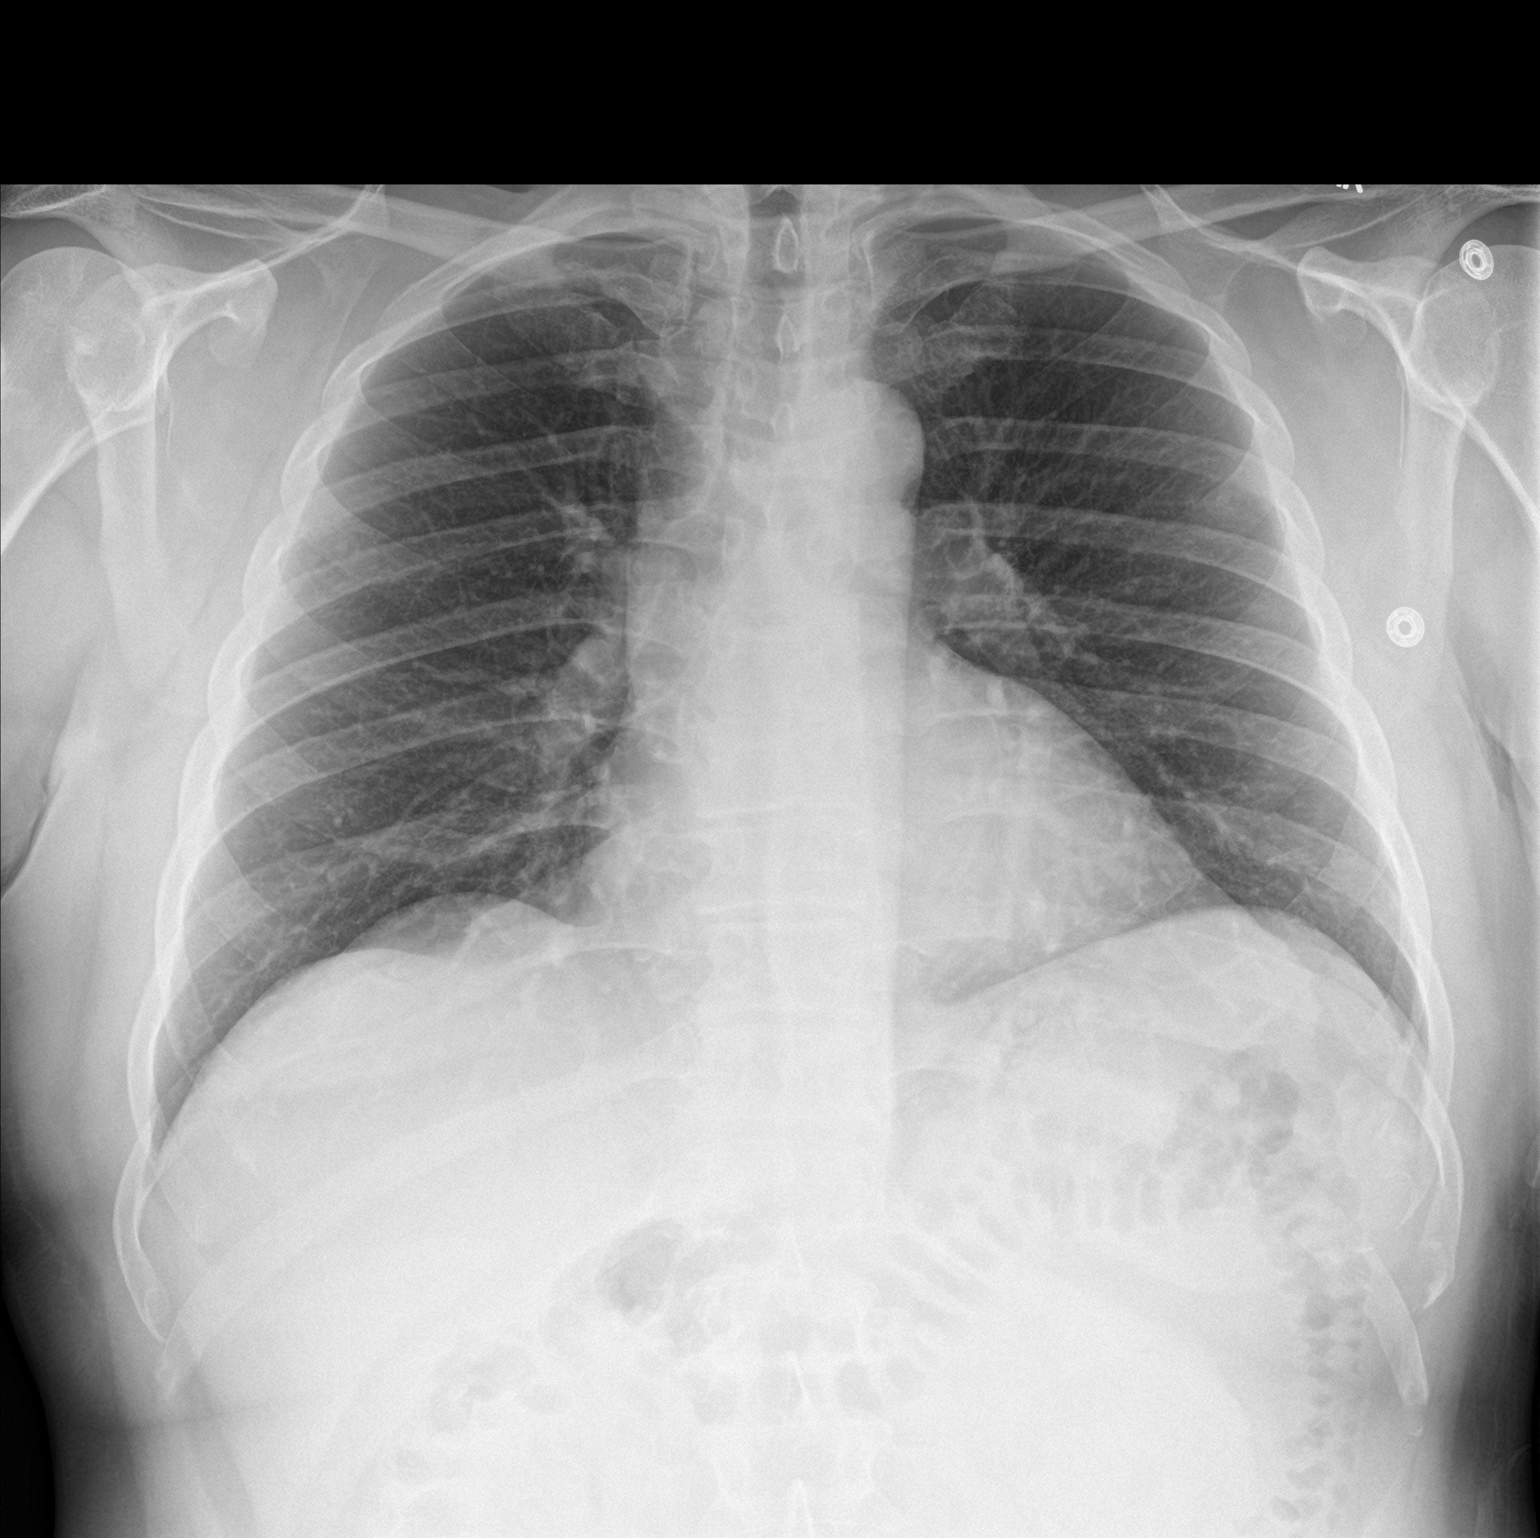

[chest lat]
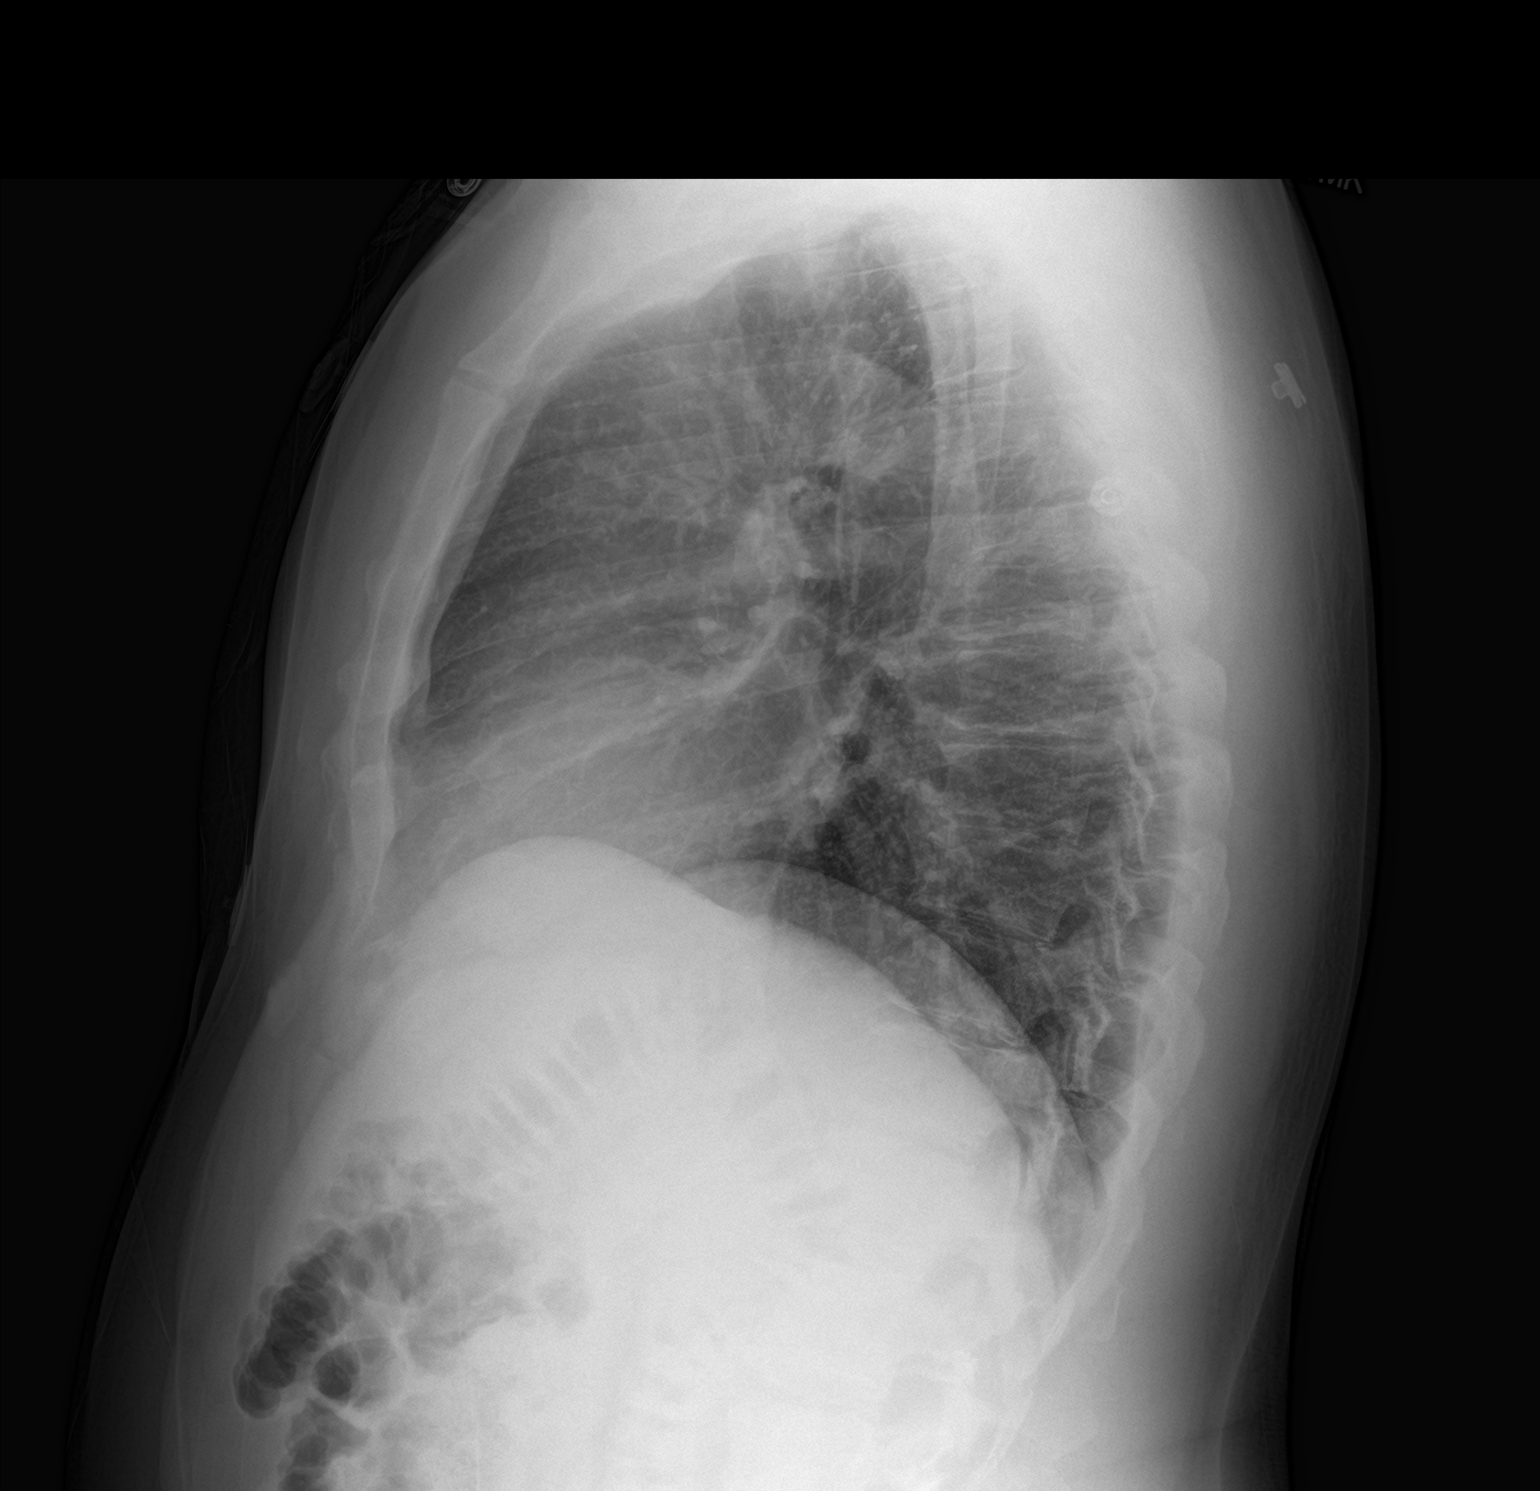

[2 of 2 positions shown; findings below may reference images not displayed]

FINDINGS: Lungs are clear. Heart size and pulmonary vascularity are normal. No
adenopathy. No pneumothorax. No bone lesions.
IMPRESSION: No edema or consolidation.

## 2017-09-03 NOTE — Progress Notes (Signed)
Subjective:    Patient ID: Ryan Reyes, male    DOB: 09-10-1966, 51 y.o.   MRN: [id number]  HPI He is here for a physical exam.   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   He played a lot of golf over the weekend and had lower back pain and right lateral elbow pain.  The pain in his back as resolved.  The elbow pain is better, but still there.  He applied pressure.     Medications and allergies reviewed with patient and updated if appropriate.  Patient Active Problem List   Diagnosis Date Noted  . GERD (gastroesophageal reflux disease) 08/30/2016  . Bilateral low back pain without sciatica 08/30/2016  . History of Legg-Calve-Perthes disease 08/18/2013  . HYPERLIPIDEMIA 09/06/2007    Current Outpatient Prescriptions on File Prior to Visit  Medication Sig Dispense Refill  . aspirin EC 81 MG tablet Take 81 mg by mouth daily.    . cetirizine (KLS ALLER-TEC) 10 MG tablet Take 10 mg by mouth daily.    Marland Kitchen esomeprazole (NEXIUM) 20 MG capsule Take 20 mg by mouth daily at 12 noon.    . Multiple Vitamin (MULTIVITAMIN WITH MINERALS) TABS tablet Take 1 tablet by mouth daily.    . simvastatin (ZOCOR) 20 MG tablet Take 1 tablet (20 mg total) by mouth at bedtime. 90 tablet 3   No current facility-administered medications on file prior to visit.     Past Medical History:  Diagnosis Date  . Allergy   . Esophageal reflux disease   . Family history of ischemic heart disease    father @ 46; CVA pre 27  . Hyperlipidemia     Past Surgical History:  Procedure Laterality Date  . APPENDECTOMY    . EYE SURGERY     Eye Trauma age 88-6   . WISDOM TOOTH EXTRACTION      Social History   Social History  . Marital status: Married    Spouse name: N/A  . Number of children: N/A  . Years of education: N/A   Social History Main Topics  . Smoking status: Never Smoker  . Smokeless tobacco: Never Used  . Alcohol use 3.6 oz/week    6 Cans of beer per week     Comment: occasionally    . Drug use: No  . Sexual activity: Not Asked   Other Topics Concern  . None   Social History Narrative   Elliptical - 2-4 times a week, golf    Family History  Problem Relation Age of Onset  . Thrombocytopenia Mother        ITP, S/P splenectomy  . Heart attack Father 19  . Stroke Father        < 48  . Melanoma Sister        1/2 sister  . Diabetes Maternal Grandmother   . Diabetes Unknown        2 M 1/2 uncles  . Esophageal cancer Unknown        1/2 brother  . Colon cancer Neg Hx   . Colon polyps Neg Hx   . Rectal cancer Neg Hx   . Stomach cancer Neg Hx   . Breast cancer Neg Hx     Review of Systems  Constitutional: Negative for chills and fever.  Eyes: Negative for visual disturbance.  Respiratory: Negative for cough, shortness of breath and wheezing.   Cardiovascular: Negative for chest pain, palpitations and leg swelling.  Gastrointestinal:  Negative for abdominal pain, blood in stool, constipation, diarrhea and nausea.  Genitourinary: Negative for difficulty urinating, dysuria and hematuria.  Musculoskeletal: Positive for arthralgias (right elbow pain). Negative for back pain and myalgias.  Skin: Negative for color change and rash.  Neurological: Negative for light-headedness and headaches.  Psychiatric/Behavioral: Negative for dysphoric mood. The patient is not nervous/anxious.        Objective:   Vitals:   09/04/17 0804  BP: (!) 160/94  Pulse: 74  Resp: 16  Temp: 98.1 F (36.7 C)  SpO2: 97%   Filed Weights   09/04/17 0804  Weight: 217 lb (98.4 kg)   Body mass index is 32.99 kg/m.  Wt Readings from Last 3 Encounters:  09/04/17 217 lb (98.4 kg)  11/20/16 212 lb (96.2 kg)  11/06/16 216 lb (98 kg)     Physical Exam Constitutional: He appears well-developed and well-nourished. No distress.  HENT:  Head: Normocephalic and atraumatic.  Right Ear: External ear normal.  Left Ear: External ear normal.  Mouth/Throat: Oropharynx is clear and moist.   Normal ear canals and TM b/l  Eyes: Conjunctivae and EOM are normal.  Neck: Neck supple. No tracheal deviation present. No thyromegaly present.  No carotid bruit  Cardiovascular: Normal rate, regular rhythm, normal heart sounds and intact distal pulses.   No murmur heard. Pulmonary/Chest: Effort normal and breath sounds normal. No respiratory distress. He has no wheezes. He has no rales.  Abdominal: Soft. He exhibits no distension. There is no tenderness.  Genitourinary: deferred  Musculoskeletal: He exhibits no edema.  Lymphadenopathy:   He has no cervical adenopathy.  Skin: Skin is warm and dry. He is not diaphoretic.  Psychiatric: He has a normal mood and affect. His behavior is normal.         Assessment & Plan:   Physical exam: Screening blood work   Done 02/2017, will repeat cmp, lipid Immunizations   Td up to date, flu today Colonoscopy   Up to date   Eye exams   Up to date  EKG  Last EKG 2016, normal EKG at [facility] 02/2017 Exercise  Plays golf, no longer exercise - was exercising regularly up until June Weight   advised Skin   No concerns, sees derm annually Substance abuse  none  See Problem List for Assessment and Plan of chronic medical problems.

## 2017-09-03 NOTE — Patient Instructions (Addendum)
Test(s) ordered today. Your results will be released to [place] (or called to you) after review, usually within 72hours after test completion. If any changes need to be made, you will be notified at that same time.  All other Health Maintenance issues reviewed.   All recommended immunizations and age-appropriate screenings are up-to-date or discussed.  Flu immunization administered today.   Medications reviewed and updated.  No changes recommended at this time.  Your prescription(s) have been submitted to your pharmacy. Please take as directed and contact our office if you believe you are having problem(s) with the medication(s).   Please followup in one year    Health Maintenance, Male A healthy lifestyle and preventive care is important for your health and wellness. Ask your health care provider about what schedule of regular examinations is right for you. What should I know about weight and diet? Eat a Healthy Diet  Eat plenty of vegetables, fruits, whole grains, low-fat dairy products, and lean protein.  Do not eat a lot of foods high in solid fats, added sugars, or salt.  Maintain a Healthy Weight Regular exercise can help you achieve or maintain a healthy weight. You should:  Do at least 150 minutes of exercise each week. The exercise should increase your heart rate and make you sweat (moderate-intensity exercise).  Do strength-training exercises at least twice a week.  Watch Your Levels of Cholesterol and Blood Lipids  Have your blood tested for lipids and cholesterol every 5 years starting at 51 years of age. If you are at high risk for heart disease, you should start having your blood tested when you are 51 years old. You may need to have your cholesterol levels checked more often if: ? Your lipid or cholesterol levels are high. ? You are older than 51 years of age. ? You are at high risk for heart disease.  What should I know about cancer screening? Many types of  cancers can be detected early and may often be prevented. Lung Cancer  You should be screened every year for lung cancer if: ? You are a current smoker who has smoked for at least 30 years. ? You are a former smoker who has quit within the past 15 years.  Talk to your health care provider about your screening options, when you should start screening, and how often you should be screened.  Colorectal Cancer  Routine colorectal cancer screening usually begins at 51 years of age and should be repeated every 5-10 years until you are 51 years old. You may need to be screened more often if early forms of precancerous polyps or small growths are found. Your health care provider may recommend screening at an earlier age if you have risk factors for colon cancer.  Your health care provider may recommend using home test kits to check for hidden blood in the stool.  A small camera at the end of a tube can be used to examine your colon (sigmoidoscopy or colonoscopy). This checks for the earliest forms of colorectal cancer.  Prostate and Testicular Cancer  Depending on your age and overall health, your health care provider may do certain tests to screen for prostate and testicular cancer.  Talk to your health care provider about any symptoms or concerns you have about testicular or prostate cancer.  Skin Cancer  Check your skin from head to toe regularly.  Tell your health care provider about any new moles or changes in moles, especially if: ? There is a  change in a mole's size, shape, or color. ? You have a mole that is larger than a pencil eraser.  Always use sunscreen. Apply sunscreen liberally and repeat throughout the day.  Protect yourself by wearing long sleeves, pants, a wide-brimmed hat, and sunglasses when outside.  What should I know about heart disease, diabetes, and high blood pressure?  If you are 18-39 years of age, have your blood pressure checked every 3-5 years. If you are  40 years of age or older, have your blood pressure checked every year. You should have your blood pressure measured twice-once when you are at a hospital or clinic, and once when you are not at a hospital or clinic. Record the average of the two measurements. To check your blood pressure when you are not at a hospital or clinic, you can use: ? An automated blood pressure machine at a pharmacy. ? A home blood pressure monitor.  Talk to your health care provider about your target blood pressure.  If you are between 45-79 years old, ask your health care provider if you should take aspirin to prevent heart disease.  Have regular diabetes screenings by checking your fasting blood sugar level. ? If you are at a normal weight and have a low risk for diabetes, have this test once every three years after the age of 45. ? If you are overweight and have a high risk for diabetes, consider being tested at a younger age or more often.  A one-time screening for abdominal aortic aneurysm (AAA) by ultrasound is recommended for men aged 65-75 years who are current or former smokers. What should I know about preventing infection? Hepatitis B If you have a higher risk for hepatitis B, you should be screened for this virus. Talk with your health care provider to find out if you are at risk for hepatitis B infection. Hepatitis C Blood testing is recommended for:  Everyone born from 1945 through 1965.  Anyone with known risk factors for hepatitis C.  Sexually Transmitted Diseases (STDs)  You should be screened each year for STDs including gonorrhea and chlamydia if: ? You are sexually active and are younger than 51 years of age. ? You are older than 51 years of age and your health care provider tells you that you are at risk for this type of infection. ? Your sexual activity has changed since you were last screened and you are at an increased risk for chlamydia or gonorrhea. Ask your health care provider if you  are at risk.  Talk with your health care provider about whether you are at high risk of being infected with HIV. Your health care provider may recommend a prescription medicine to help prevent HIV infection.  What else can I do?  Schedule regular health, dental, and eye exams.  Stay current with your vaccines (immunizations).  Do not use any tobacco products, such as cigarettes, chewing tobacco, and e-cigarettes. If you need help quitting, ask your health care provider.  Limit alcohol intake to no more than 2 drinks per day. One drink equals 12 ounces of beer, 5 ounces of wine, or 1 ounces of hard liquor.  Do not use street drugs.  Do not share needles.  Ask your health care provider for help if you need support or information about quitting drugs.  Tell your health care provider if you often feel depressed.  Tell your health care provider if you have ever been abused or do not feel safe at home.   This information is not intended to replace advice given to you by your health care provider. Make sure you discuss any questions you have with your health care provider. Document Released: 05/25/2008 Document Revised: 07/26/2016 Document Reviewed: 08/31/2015 Elsevier Interactive Patient Education  2018 [employer] Inc.  

## 2017-09-04 ENCOUNTER — Encounter: Payer: Self-pay | Admitting: Internal Medicine

## 2017-09-04 ENCOUNTER — Ambulatory Visit (INDEPENDENT_AMBULATORY_CARE_PROVIDER_SITE_OTHER): Payer: 59 | Admitting: Internal Medicine

## 2017-09-04 ENCOUNTER — Other Ambulatory Visit (INDEPENDENT_AMBULATORY_CARE_PROVIDER_SITE_OTHER): Payer: 59

## 2017-09-04 VITALS — BP 160/94 | HR 74 | Temp 98.1°F | Resp 16 | Ht 68.0 in | Wt 217.0 lb

## 2017-09-04 DIAGNOSIS — Z0000 Encounter for general adult medical examination without abnormal findings: Secondary | ICD-10-CM

## 2017-09-04 DIAGNOSIS — K219 Gastro-esophageal reflux disease without esophagitis: Secondary | ICD-10-CM | POA: Diagnosis not present

## 2017-09-04 DIAGNOSIS — Z23 Encounter for immunization: Secondary | ICD-10-CM | POA: Diagnosis not present

## 2017-09-04 DIAGNOSIS — E785 Hyperlipidemia, unspecified: Secondary | ICD-10-CM

## 2017-09-04 DIAGNOSIS — E782 Mixed hyperlipidemia: Secondary | ICD-10-CM

## 2017-09-04 DIAGNOSIS — R030 Elevated blood-pressure reading, without diagnosis of hypertension: Secondary | ICD-10-CM | POA: Insufficient documentation

## 2017-09-04 LAB — COMPREHENSIVE METABOLIC PANEL
ALBUMIN: 4.4 g/dL (ref 3.5–5.2)
ALT: 24 U/L (ref 0–53)
AST: 23 U/L (ref 0–37)
Alkaline Phosphatase: 68 U/L (ref 39–117)
BILIRUBIN TOTAL: 0.6 mg/dL (ref 0.2–1.2)
BUN: 15 mg/dL (ref 6–23)
CALCIUM: 9.6 mg/dL (ref 8.4–10.5)
CO2: 30 mEq/L (ref 19–32)
CREATININE: 0.92 mg/dL (ref 0.40–1.50)
Chloride: 102 mEq/L (ref 96–112)
GFR: 92.18 mL/min (ref >60.00)
Glucose, Bld: 102 mg/dL — ABNORMAL HIGH (ref 70–99)
Potassium: 4.1 mEq/L (ref 3.5–5.1)
Sodium: 139 mEq/L (ref 135–145)
TOTAL PROTEIN: 7.2 g/dL (ref 6.0–8.3)

## 2017-09-04 LAB — LIPID PANEL
CHOLESTEROL: 173 mg/dL (ref 0–200)
HDL: 48.5 mg/dL (ref >39.00)
LDL Cholesterol: 102 mg/dL — ABNORMAL HIGH (ref 0–99)
NonHDL: 124.3
TRIGLYCERIDES: 110 mg/dL (ref 0.0–149.0)
Total CHOL/HDL Ratio: 4
VLDL: 22 mg/dL (ref 0.0–40.0)

## 2017-09-04 MED ORDER — SIMVASTATIN 20 MG PO TABS: 20.0000 mg | ORAL_TABLET | Freq: Every day | ORAL | 3 refills | Status: DC

## 2017-09-04 MED ORDER — RANITIDINE HCL 150 MG PO TABS: 150.0000 mg | ORAL_TABLET | Freq: Two times a day (BID) | ORAL | 3 refills | Status: DC

## 2017-09-04 NOTE — Addendum Note (Signed)
Addended by: Terence Lux B on: 09/04/2017 09:08 AM   Modules accepted: Orders

## 2017-09-04 NOTE — Assessment & Plan Note (Signed)
BP high today and high at [facility] - discussed he may be developing HTN Restart regular exercise, weight loss, low sodium diet He will monitor for now - discussed goals - will hold off on medication for now

## 2017-09-04 NOTE — Assessment & Plan Note (Signed)
Check lipid panel  Continue daily statin Regular exercise and healthy diet encouraged  

## 2017-09-04 NOTE — Assessment & Plan Note (Addendum)
Taking nexium daily - GERD controlled Will try to transition off of medication -  Start zantac BID and taper off nexium  GERD diet Weight loss If he continues to have GERD can consider referral for EGD

## 2017-09-06 ENCOUNTER — Encounter: Payer: Self-pay | Admitting: Internal Medicine

## 2017-09-06 ENCOUNTER — Telehealth: Payer: Self-pay | Admitting: Emergency Medicine

## 2017-09-06 NOTE — Telephone Encounter (Signed)
PA was completed for Ranitidine and denied for outcome. Please advise on alternative.

## 2017-09-06 NOTE — Telephone Encounter (Signed)
He can purchase it over the counter or we can try generic pepcic 40 mg 1-2 times a day ( not sure if that will be covered)

## 2017-09-07 NOTE — Telephone Encounter (Signed)
Spoke with pt, he is going to try an OTC medication first.

## 2017-09-07 NOTE — Telephone Encounter (Signed)
LVM for pt to call back and discuss.  

## 2017-09-29 ENCOUNTER — Other Ambulatory Visit: Payer: Self-pay | Admitting: Internal Medicine

## 2017-10-08 DIAGNOSIS — D2362 Other benign neoplasm of skin of left upper limb, including shoulder: Secondary | ICD-10-CM | POA: Diagnosis not present

## 2017-10-08 DIAGNOSIS — D2261 Melanocytic nevi of right upper limb, including shoulder: Secondary | ICD-10-CM | POA: Diagnosis not present

## 2017-10-08 DIAGNOSIS — L918 Other hypertrophic disorders of the skin: Secondary | ICD-10-CM | POA: Diagnosis not present

## 2017-10-08 DIAGNOSIS — D2262 Melanocytic nevi of left upper limb, including shoulder: Secondary | ICD-10-CM | POA: Diagnosis not present

## 2017-12-14 DIAGNOSIS — Z719 Counseling, unspecified: Secondary | ICD-10-CM | POA: Diagnosis not present

## 2017-12-27 DIAGNOSIS — Z719 Counseling, unspecified: Secondary | ICD-10-CM | POA: Diagnosis not present

## 2018-01-03 DIAGNOSIS — Z719 Counseling, unspecified: Secondary | ICD-10-CM | POA: Diagnosis not present

## 2018-01-10 DIAGNOSIS — Z719 Counseling, unspecified: Secondary | ICD-10-CM | POA: Diagnosis not present

## 2018-01-17 DIAGNOSIS — Z719 Counseling, unspecified: Secondary | ICD-10-CM | POA: Diagnosis not present

## 2018-02-01 ENCOUNTER — Ambulatory Visit: Payer: Self-pay

## 2018-02-01 NOTE — Telephone Encounter (Signed)
Pt. Will try home remedies - will call back as needed.  Reason for Disposition . [1] Sinus congestion as part of a cold AND [2] present < 10 days  Answer Assessment - Initial Assessment Questions 1. LOCATION: "Where does it hurt?"      Around the eyes and ears 2. ONSET: "When did the sinus pain start?"  (e.g., hours, days)      Started 2 days ago 3. SEVERITY: "How bad is the pain?"   (Scale 1-10; mild, moderate or severe)   - MILD (1-3): doesn't interfere with normal activities    - MODERATE (4-7): interferes with normal activities (e.g., work or school) or awakens from sleep   - SEVERE (8-10): excruciating pain and patient unable to do any normal activities        5 4. RECURRENT SYMPTOM: "Have you ever had sinus problems before?" If so, ask: "When was the last time?" and "What happened that time?"      Yes 5. NASAL CONGESTION: "Is the nose blocked?" If so, ask, "Can you open it or must you breathe through the mouth?"     Yes 6. NASAL DISCHARGE: "Do you have discharge from your nose?" If so ask, "What color?"     Clear 7. FEVER: "Do you have a fever?" If so, ask: "What is it, how was it measured, and when did it start?"      No - had some sweating 8. OTHER SYMPTOMS: "Do you have any other symptoms?" (e.g., sore throat, cough, earache, difficulty breathing)     Ears feel stopped up 9. PREGNANCY: "Is there any chance you are pregnant?" "When was your last menstrual period?"     No  Protocols used: SINUS PAIN OR CONGESTION-A-AH

## 2018-03-08 DIAGNOSIS — M545 Low back pain: Secondary | ICD-10-CM | POA: Diagnosis not present

## 2018-03-08 DIAGNOSIS — M47816 Spondylosis without myelopathy or radiculopathy, lumbar region: Secondary | ICD-10-CM | POA: Diagnosis not present

## 2018-04-09 ENCOUNTER — Telehealth: Payer: Self-pay | Admitting: Emergency Medicine

## 2018-04-09 DIAGNOSIS — K219 Gastro-esophageal reflux disease without esophagitis: Secondary | ICD-10-CM

## 2018-04-09 NOTE — Telephone Encounter (Signed)
Spoke with pt, states he has already been called by GI and has an appt scheduled.

## 2018-04-09 NOTE — Telephone Encounter (Signed)
GI referral ordered-he will see the same doctor that did his colonoscopy.  They will contact him.

## 2018-04-09 NOTE — Telephone Encounter (Signed)
Copied from [place] [phone]. Topic: Referral - Request >> Apr 09, 2018  8:56 AM Scherrie Gerlach wrote: Reason for CRM: pt would like a referral for an endoscopy for his ongoing acid reflux.  Pt states his dr at [facility] advised him to call his pcp for referral. Pt states dr burns aware of his acid reflux

## 2018-06-07 ENCOUNTER — Ambulatory Visit (INDEPENDENT_AMBULATORY_CARE_PROVIDER_SITE_OTHER): Payer: 59 | Admitting: Gastroenterology

## 2018-06-07 ENCOUNTER — Encounter: Payer: Self-pay | Admitting: Gastroenterology

## 2018-06-07 VITALS — BP 134/82 | HR 70 | Ht 67.0 in | Wt 220.1 lb

## 2018-06-07 DIAGNOSIS — Z860100 Personal history of colon polyps, unspecified: Secondary | ICD-10-CM

## 2018-06-07 DIAGNOSIS — K219 Gastro-esophageal reflux disease without esophagitis: Secondary | ICD-10-CM | POA: Diagnosis not present

## 2018-06-07 NOTE — Patient Instructions (Addendum)
If you are age 51 or older, your body mass index should be between 23-30. Your Body mass index is 34.48 kg/m. If this is out of the aforementioned range listed, please consider follow up with your Primary Care Provider.  If you are age 59 or younger, your body mass index should be between 19-25. Your Body mass index is 34.48 kg/m. If this is out of the aformentioned range listed, please consider follow up with your Primary Care Provider.   You have been scheduled for an endoscopy. Please follow written instructions given to you at your visit today. If you use inhalers (even only as needed), please bring them with you on the day of your procedure. Your physician has requested that you go to www.startemmi.com and enter the access code given to you at your visit today. This web site gives a general overview about your procedure. However, you should still follow specific instructions given to you by our office regarding your preparation for the procedure  You will be due for a recall colonoscopy in December 2020. We will send you a reminder in the mail when it gets closer to that time.   Thank you for entrusting me with your care and for choosing [facility], Dr. Marissa Cellar

## 2018-06-07 NOTE — Progress Notes (Signed)
HPI :  52 year old male with a history of reflux, hyperlipidemia, allergies, here for a follow-up visit to discuss his reflux.  He is known to me from his screening colonoscopy which was done in December 2017. He was found to have 3 small sessile serrated polyps at the time, due for surveillance colonoscopy in 11/2019.  He states he has had reflux symptoms ongoing for several years. He endorses his reflux has a sense of discomfort in his chest, senses "something coming up", that can make him feel lightheaded. He states he initially had a cardiac evaluation which was negative. He was given Nexium historically which resolved the symptoms. He had been using daily Nexium for a few years due to the frequency of his symptoms which worked quite well for him. More recently he has cut back to using it as needed and is using apple cider vinegar capsules which he states seems to be working fairly well for him. He has not had any prior tobacco use. His half brother was diagnosed with esophageal cancer at age 60. He is never had a prior upper endoscopy. He denies any dysphagia. He denies any postprandial abdominal pain or vomiting. He is eating well, no unexpected weight loss.  Colonoscopy 11/20/2016 - 3 small sessile serrated polyps / adenomas, internal hemorrhoids - surveillance due 11/2019   Past Medical History:  Diagnosis Date  . Allergy   . Esophageal reflux disease   . Family history of ischemic heart disease    father @ 49; CVA pre 91  . Hyperlipidemia      Past Surgical History:  Procedure Laterality Date  . APPENDECTOMY    . EYE SURGERY     Eye Trauma age 16-6   . WISDOM TOOTH EXTRACTION     Family History  Problem Relation Age of Onset  . Thrombocytopenia Mother        ITP, S/P splenectomy  . Heart attack Father 73  . Stroke Father        < 63  . Melanoma Sister        1/2 sister  . Diabetes Maternal Grandmother   . Diabetes Unknown        2 M 1/2 uncles  . Esophageal cancer  Unknown        1/2 brother  . Colon cancer Neg Hx   . Colon polyps Neg Hx   . Rectal cancer Neg Hx   . Stomach cancer Neg Hx   . Breast cancer Neg Hx    Social History   Tobacco Use  . Smoking status: Never Smoker  . Smokeless tobacco: Never Used  Substance Use Topics  . Alcohol use: Yes    Alcohol/week: 3.6 oz    Types: 6 Cans of beer per week    Comment: occasionally  . Drug use: No   Current Outpatient Medications  Medication Sig Dispense Refill  . Apple Cider Vinegar 188 MG CAPS Take 376 mg by mouth daily.    Marland Kitchen aspirin EC 81 MG tablet Take 81 mg by mouth daily.    . cetirizine (KLS ALLER-TEC) 10 MG tablet Take 10 mg by mouth daily.    Marland Kitchen esomeprazole (NEXIUM) 20 MG capsule Take 20 mg by mouth as needed.     . Multiple Vitamin (MULTIVITAMIN WITH MINERALS) TABS tablet Take 1 tablet by mouth daily.    . simvastatin (ZOCOR) 20 MG tablet Take 1 tablet (20 mg total) by mouth at bedtime. 90 tablet 3   No current facility-administered  medications for this visit.    Allergies  Allergen Reactions  . Aspirin     Gastritis with 325 mg dose He tolerates ECASA 81 mg qd  . Penicillins     ? Reaction as child     Review of Systems: All systems reviewed and negative except where noted in HPI.   Lab Results  Component Value Date   WBC 6.1 08/30/2016   HGB 15.4 08/30/2016   HCT 44.1 08/30/2016   MCV 90.4 08/30/2016   PLT 274.0 08/30/2016    Lab Results  Component Value Date   CREATININE 0.92 09/04/2017   BUN 15 09/04/2017   NA 139 09/04/2017   K 4.1 09/04/2017   CL 102 09/04/2017   CO2 30 09/04/2017    Lab Results  Component Value Date   ALT 24 09/04/2017   AST 23 09/04/2017   ALKPHOS 68 09/04/2017   BILITOT 0.6 09/04/2017     Physical Exam: BP 134/82   Pulse 70   Ht 5\' 7"  (1.702 m)   Wt 220 lb 2 oz (99.8 kg)   BMI 34.48 kg/m  Constitutional: Pleasant,well-developed, male in no acute distress. HEENT: Normocephalic and atraumatic. Conjunctivae are  normal. No scleral icterus. Neck supple.  Cardiovascular: Normal rate, regular rhythm.  Pulmonary/chest: Effort normal and breath sounds normal. No wheezing, rales or rhonchi. Abdominal: Soft, nondistended, nontender. . There are no masses palpable. No hepatomegaly. Extremities: no edema Lymphadenopathy: No cervical adenopathy noted. Neurological: Alert and oriented to person place and time. Skin: Skin is warm and dry. No rashes noted. Psychiatric: Normal mood and affect. Behavior is normal.   ASSESSMENT AND PLAN: 52 year old male here for assessment following issues:  GERD - long-standing symptoms, fairly well controlled currently, using Nexium as needed. He inquires about upper endoscopy. We discussed current guideline criteria for upper endoscopy to screen for Barrett's esophagus. Given he has a long-standing symptoms of reflux, is greater than 46 years old, is Caucasian, and that he has a a half-brother with esophageal cancer diagnosed at a young age, he meets criteria for Barrett's screening and I offered him an endoscopy. We discussed the risks and benefits of endoscopy and anesthesia with him, and he wanted to proceed. Further recommendations pending the results. He'll continue his medication regimen as outlined above currently visits work fairly well for him. He understands that if he has Barrett's esophagus we will recommend daily use of PPI.  History of colon polyps - due for surveillance colonoscopy in 11/2019  [place] Cellar, MD [facility]

## 2018-08-08 ENCOUNTER — Encounter: Payer: 59 | Admitting: Gastroenterology

## 2018-08-13 ENCOUNTER — Ambulatory Visit (AMBULATORY_SURGERY_CENTER): Payer: 59 | Admitting: Gastroenterology

## 2018-08-13 ENCOUNTER — Encounter: Payer: Self-pay | Admitting: Gastroenterology

## 2018-08-13 VITALS — BP 133/85 | HR 65 | Temp 99.6°F | Resp 21 | Ht 67.0 in | Wt 220.0 lb

## 2018-08-13 DIAGNOSIS — K2970 Gastritis, unspecified, without bleeding: Secondary | ICD-10-CM

## 2018-08-13 DIAGNOSIS — K269 Duodenal ulcer, unspecified as acute or chronic, without hemorrhage or perforation: Secondary | ICD-10-CM

## 2018-08-13 DIAGNOSIS — K219 Gastro-esophageal reflux disease without esophagitis: Secondary | ICD-10-CM

## 2018-08-13 DIAGNOSIS — K2950 Unspecified chronic gastritis without bleeding: Secondary | ICD-10-CM | POA: Diagnosis not present

## 2018-08-13 MED ORDER — SODIUM CHLORIDE 0.9 % IV SOLN: 500.0000 mL | Freq: Once | INTRAVENOUS | Status: DC

## 2018-08-13 NOTE — Progress Notes (Signed)
Pt's states no medical or surgical changes since previsit or office visit. 

## 2018-08-13 NOTE — Progress Notes (Signed)
Called to room to assist during endoscopic procedure.  Patient ID and intended procedure confirmed with present staff. Received instructions for my participation in the procedure from the performing physician.  

## 2018-08-13 NOTE — Progress Notes (Signed)
A and O x3. Report to RN. Tolerated MAC anesthesia well.Teeth unchanged after procedure.

## 2018-08-13 NOTE — Op Note (Signed)
[facility] Patient Name: Ryan Reyes Procedure Date: 08/13/2018 8:29 AM MRN: [id number] Endoscopist: Remo Lipps P. Havery Moros , MD Age: 52 Referring MD:  Date of Birth: Mar 26, 1966 Gender: Male Account #: [id number] Procedure:                Upper GI endoscopy Indications:              Screening for Barrett's esophagus (longstanding                            reflux), using Nexium as needed Medicines:                Monitored Anesthesia Care Procedure:                Pre-Anesthesia Assessment:                           - Prior to the procedure, a History and Physical                            was performed, and patient medications and                            allergies were reviewed. The patient's tolerance of                            previous anesthesia was also reviewed. The risks                            and benefits of the procedure and the sedation                            options and risks were discussed with the patient.                            All questions were answered, and informed consent                            was obtained. Prior Anticoagulants: The patient has                            taken no previous anticoagulant or antiplatelet                            agents. ASA Grade Assessment: II - A patient with                            mild systemic disease. After reviewing the risks                            and benefits, the patient was deemed in                            satisfactory condition to undergo the procedure.  After obtaining informed consent, the endoscope was                            passed under direct vision. Throughout the                            procedure, the patient's blood pressure, pulse, and                            oxygen saturations were monitored continuously. The                            Model GIF-[id number]) scope was introduced                            through the mouth, and  advanced to the second part                            of duodenum. The upper GI endoscopy was                            accomplished without difficulty. The patient                            tolerated the procedure well. Scope In: Scope Out: Findings:                 Esophagogastric landmarks were identified: the                            Z-line was found at 38 cm, the gastroesophageal                            junction was found at 38 cm and the upper extent of                            the gastric folds was found at 38 cm from the                            incisors.                           The exam of the esophagus was otherwise normal. No                            evidence of Barrett's                           A few small erosions were found in the gastric body                            without overt ulceration.                           The exam of the stomach  was otherwise normal.                           Biopsies were taken with a cold forceps in the                            gastric body, at the incisura and in the gastric                            antrum for Helicobacter pylori testing.                           A few non-bleeding superficial duodenal ulcers with                            no stigmata of bleeding were found in the duodenal                            bulb. The largest lesion was 7 mm in largest                            dimension.                           The exam of the duodenum was otherwise normal. Complications:            No immediate complications. Estimated blood loss:                            Minimal. Estimated Blood Loss:     Estimated blood loss was minimal. Impression:               - Esophagogastric landmarks identified.                           - Normal esophagus - no evidence of Barrett's                            esophagus                           - Erosive gastropathy.                           - Multiple non-bleeding  duodenal ulcers with no                            stigmata of bleeding.                           - Biopsies were taken with a cold forceps for                            Helicobacter pylori testing. Recommendation:           - Patient has a contact number available for  emergencies. The signs and symptoms of potential                            delayed complications were discussed with the                            patient. Return to normal activities tomorrow.                            Written discharge instructions were provided to the                            patient.                           - Resume previous diet.                           - Continue present medications.                           - Take your nexium every day for 6 weeks to heal                            duodenal ulcers / gastritis                           - Await pathology results.                           - Avoid NSAIDs Carlota Raspberry. Armbruster, MD 08/13/2018 8:42:31 AM This report has been signed electronically.

## 2018-08-13 NOTE — Patient Instructions (Signed)
  Thank you for allowing [place] to care for you today!  Take Nexium every day for 6 weeks to heal the duodenal ulcer and gastritis.  Avoid NSAIDS.        YOU HAD AN ENDOSCOPIC PROCEDURE TODAY AT [facility]:   Refer to the procedure report that was given to you for any specific questions about what was found during the examination.  If the procedure report does not answer your questions, please call your gastroenterologist to clarify.  If you requested that your care partner not be given the details of your procedure findings, then the procedure report has been included in a sealed envelope for you to review at your convenience later.  YOU SHOULD EXPECT: Some feelings of bloating in the abdomen. Passage of more gas than usual.  Walking can help get rid of the air that was put into your GI tract during the procedure and reduce the bloating. If you had a lower endoscopy (such as a colonoscopy or flexible sigmoidoscopy) you may notice spotting of blood in your stool or on the toilet paper. If you underwent a bowel prep for your procedure, you may not have a normal bowel movement for a few days.  Please Note:  You might notice some irritation and congestion in your nose or some drainage.  This is from the oxygen used during your procedure.  There is no need for concern and it should clear up in a day or so.  SYMPTOMS TO REPORT IMMEDIATELY:     Following upper endoscopy (EGD)  Vomiting of blood or coffee ground material  New chest pain or pain under the shoulder blades  Painful or persistently difficult swallowing  New shortness of breath  Fever of 100F or higher  Black, tarry-looking stools  For urgent or emergent issues, a gastroenterologist can be reached at any hour by calling [phone].   DIET:  We do recommend a small meal at first, but then you may proceed to your regular diet.  Drink plenty of fluids but you should avoid alcoholic beverages for 24  hours.  ACTIVITY:  You should plan to take it easy for the rest of today and you should NOT DRIVE or use heavy machinery until tomorrow (because of the sedation medicines used during the test).    FOLLOW UP: Our staff will call the number listed on your records the next business day following your procedure to check on you and address any questions or concerns that you may have regarding the information given to you following your procedure. If we do not reach you, we will leave a message.  However, if you are feeling well and you are not experiencing any problems, there is no need to return our call.  We will assume that you have returned to your regular daily activities without incident.  If any biopsies were taken you will be contacted by phone or by letter within the next 1-3 weeks.  Please call us at [phone] if you have not heard about the biopsies in 3 weeks.    SIGNATURES/CONFIDENTIALITY: You and/or your care partner have signed paperwork which will be entered into your electronic medical record.  These signatures attest to the fact that that the information above on your After Visit Summary has been reviewed and is understood.  Full responsibility of the confidentiality of this discharge information lies with you and/or your care-partner.

## 2018-08-14 ENCOUNTER — Telehealth: Payer: Self-pay | Admitting: *Deleted

## 2018-08-14 NOTE — Telephone Encounter (Signed)
  Follow up Call-  Call back number 08/13/2018 11/20/2016  Post procedure Call Back phone  # [phone]  Permission to leave phone message Yes Yes  Some recent data might be hidden     Patient questions:  Do you have a fever, pain , or abdominal swelling? No. Pain Score  0 *  Have you tolerated food without any problems? Yes.    Have you been able to return to your normal activities? Yes.    Do you have any questions about your discharge instructions: Diet   No. Medications  No. Follow up visit  No.  Do you have questions or concerns about your Care? No.  Actions: * If pain score is 4 or above: No action needed, pain <4.

## 2018-09-04 ENCOUNTER — Encounter: Payer: Self-pay | Admitting: Internal Medicine

## 2018-09-04 NOTE — Progress Notes (Signed)
Subjective:    Patient ID: Ryan Reyes, male    DOB: 1966/03/24, 52 y.o.   MRN: [id number]  HPI He is here for a physical exam.   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    He had an EGD not long ago and has PUD with mild gastritis.  He is taking nexium daily for 6 weeks.  He only takes advil or aleve as needed.  He takes a baby aspirin daily.    He wants to work on weight loss.  He has not been exercising regularly, but will start more regular exercise.  He does not think he overeats.   Medications and allergies reviewed with patient and updated if appropriate.  Patient Active Problem List   Diagnosis Date Noted  . Elevated blood pressure reading 09/04/2017  . GERD (gastroesophageal reflux disease) 08/30/2016  . Bilateral low back pain without sciatica 08/30/2016  . History of Legg-Calve-Perthes disease 08/18/2013  . HYPERLIPIDEMIA 09/06/2007    Current Outpatient Medications on File Prior to Visit  Medication Sig Dispense Refill  . Apple Cider Vinegar 188 MG CAPS Take 376 mg by mouth daily.    Marland Kitchen aspirin EC 81 MG tablet Take 81 mg by mouth daily.    . cetirizine (KLS ALLER-TEC) 10 MG tablet Take 10 mg by mouth daily.    Marland Kitchen esomeprazole (NEXIUM) 20 MG capsule Take 20 mg by mouth as needed.     . Multiple Vitamin (MULTIVITAMIN WITH MINERALS) TABS tablet Take 1 tablet by mouth daily.    . simvastatin (ZOCOR) 20 MG tablet Take 1 tablet (20 mg total) by mouth at bedtime. 90 tablet 3   No current facility-administered medications on file prior to visit.     Past Medical History:  Diagnosis Date  . Allergy   . Esophageal reflux disease   . Family history of ischemic heart disease    father @ 18; CVA pre 21  . Hyperlipidemia     Past Surgical History:  Procedure Laterality Date  . APPENDECTOMY    . EYE SURGERY     Eye Trauma age 67-6   . WISDOM TOOTH EXTRACTION      Social History   Socioeconomic History  . Marital  status: Married    Spouse name: Not on file  . Number of children: Not on file  . Years of education: Not on file  . Highest education level: Not on file  Occupational History  . Not on file  Social Needs  . Financial resource strain: Not on file  . Food insecurity:    Worry: Not on file    Inability: Not on file  . Transportation needs:    Medical: Not on file    Non-medical: Not on file  Tobacco Use  . Smoking status: Never Smoker  . Smokeless tobacco: Never Used  Substance and Sexual Activity  . Alcohol use: Yes    Alcohol/week: 6.0 standard drinks    Types: 6 Cans of beer per week    Comment: occasionally  . Drug use: No  . Sexual activity: Not on file  Lifestyle  . Physical activity:    Days per week: Not on file    Minutes per session: Not on file  . Stress: Not on file  Relationships  . Social connections:    Talks on phone: Not on file    Gets together: Not on file    Attends religious service:  Not on file    Active member of club or organization: Not on file    Attends meetings of clubs or organizations: Not on file    Relationship status: Not on file  Other Topics Concern  . Not on file  Social History Narrative   Elliptical - 2-4 times a week, golf    Family History  Problem Relation Age of Onset  . Thrombocytopenia Mother        ITP, S/P splenectomy  . Heart attack Father 19  . Stroke Father        < 3  . Melanoma Sister        1/2 sister  . Diabetes Maternal Grandmother   . Diabetes Unknown        2 M 1/2 uncles  . Esophageal cancer Unknown        1/2 brother  . Colon cancer Neg Hx   . Colon polyps Neg Hx   . Rectal cancer Neg Hx   . Stomach cancer Neg Hx   . Breast cancer Neg Hx     Review of Systems  Constitutional: Negative for chills and fever.  Eyes: Negative for visual disturbance.  Respiratory: Negative for cough, shortness of breath and wheezing.   Cardiovascular: Negative for chest pain, palpitations and leg swelling.    Gastrointestinal: Negative for abdominal pain, blood in stool (no black stool), constipation, diarrhea and nausea.       No gerd  Genitourinary: Negative for dysuria and hematuria.  Musculoskeletal: Positive for back pain (intermittent lower back pain). Negative for arthralgias.  Skin: Negative for color change and rash.  Neurological: Negative for light-headedness and headaches.  Psychiatric/Behavioral: Negative for dysphoric mood. The patient is not nervous/anxious.        Objective:   Vitals:   09/05/18 0804  BP: 122/80  Pulse: 78  Temp: 97.8 F (36.6 C)  SpO2: 96%   Filed Weights   09/05/18 0804  Weight: 218 lb (98.9 kg)   Body mass index is 34.14 kg/m.  Wt Readings from Last 3 Encounters:  09/05/18 218 lb (98.9 kg)  08/13/18 220 lb (99.8 kg)  06/07/18 220 lb 2 oz (99.8 kg)     Physical Exam Constitutional: He appears well-developed and well-nourished. No distress.  HENT:  Head: Normocephalic and atraumatic.  Right Ear: External ear normal.  Left Ear: External ear normal.  Mouth/Throat: Oropharynx is clear and moist.  Normal ear canals and TM b/l  Eyes: Conjunctivae and EOM are normal.  Neck: Neck supple. No tracheal deviation present. No thyromegaly present. No carotid bruit  Cardiovascular: Normal rate, regular rhythm, normal heart sounds and intact distal pulses.   No murmur heard. Pulmonary/Chest: Effort normal and breath sounds normal. No respiratory distress. He has no wheezes. He has no rales.  Abdominal: Soft. He exhibits no distension. There is no tenderness.  Genitourinary: deferred  Musculoskeletal: He exhibits no edema.  Lymphadenopathy:   He has no cervical adenopathy.  Skin: Skin is warm and dry. He is not diaphoretic.  Psychiatric: He has a normal mood and affect. His behavior is normal.  .        Assessment & Plan:   Physical exam: Screening blood work   ordered Immunizations   Flu vaccine today,  Discussed shingrix, td up to  date Colonoscopy    Up to date  Eye exams  Up to date  EKG   Done 02/2017 Exercise  Exercising intermittently - will make it more consistent Weight  Advised weight loss Skin  No concerns - sees derm annually Substance abuse   none  See Problem List for Assessment and Plan of chronic medical problems.    FU in one year

## 2018-09-04 NOTE — Patient Instructions (Addendum)
Tests ordered today. Your results will be released to [place] (or called to you) after review, usually within 72hours after test completion. If any changes need to be made, you will be notified at that same time.  All other Health Maintenance issues reviewed.   All recommended immunizations and age-appropriate screenings are up-to-date or discussed.  Flu immunization administered today.    Medications reviewed and updated.  Changes include :   Stop the baby aspirin  Your prescription(s) have been submitted to your pharmacy. Please take as directed and contact our office if you believe you are having problem(s) with the medication(s).   Please followup in one year    Health Maintenance, Male A healthy lifestyle and preventive care is important for your health and wellness. Ask your health care provider about what schedule of regular examinations is right for you. What should I know about weight and diet? Eat a Healthy Diet  Eat plenty of vegetables, fruits, whole grains, low-fat dairy products, and lean protein.  Do not eat a lot of foods high in solid fats, added sugars, or salt.  Maintain a Healthy Weight Regular exercise can help you achieve or maintain a healthy weight. You should:  Do at least 150 minutes of exercise each week. The exercise should increase your heart rate and make you sweat (moderate-intensity exercise).  Do strength-training exercises at least twice a week.  Watch Your Levels of Cholesterol and Blood Lipids  Have your blood tested for lipids and cholesterol every 5 years starting at 52 years of age. If you are at high risk for heart disease, you should start having your blood tested when you are 52 years old. You may need to have your cholesterol levels checked more often if: ? Your lipid or cholesterol levels are high. ? You are older than 52 years of age. ? You are at high risk for heart disease.  What should I know about cancer screening? Many types  of cancers can be detected early and may often be prevented. Lung Cancer  You should be screened every year for lung cancer if: ? You are a current smoker who has smoked for at least 30 years. ? You are a former smoker who has quit within the past 15 years.  Talk to your health care provider about your screening options, when you should start screening, and how often you should be screened.  Colorectal Cancer  Routine colorectal cancer screening usually begins at 52 years of age and should be repeated every 5-10 years until you are 52 years old. You may need to be screened more often if early forms of precancerous polyps or small growths are found. Your health care provider may recommend screening at an earlier age if you have risk factors for colon cancer.  Your health care provider may recommend using home test kits to check for hidden blood in the stool.  A small camera at the end of a tube can be used to examine your colon (sigmoidoscopy or colonoscopy). This checks for the earliest forms of colorectal cancer.  Prostate and Testicular Cancer  Depending on your age and overall health, your health care provider may do certain tests to screen for prostate and testicular cancer.  Talk to your health care provider about any symptoms or concerns you have about testicular or prostate cancer.  Skin Cancer  Check your skin from head to toe regularly.  Tell your health care provider about any new moles or changes in moles, especially if: ?  There is a change in a mole's size, shape, or color. ? You have a mole that is larger than a pencil eraser.  Always use sunscreen. Apply sunscreen liberally and repeat throughout the day.  Protect yourself by wearing long sleeves, pants, a wide-brimmed hat, and sunglasses when outside.  What should I know about heart disease, diabetes, and high blood pressure?  If you are 88-28 years of age, have your blood pressure checked every 3-5 years. If you  are 16 years of age or older, have your blood pressure checked every year. You should have your blood pressure measured twice-once when you are at a hospital or clinic, and once when you are not at a hospital or clinic. Record the average of the two measurements. To check your blood pressure when you are not at a hospital or clinic, you can use: ? An automated blood pressure machine at a pharmacy. ? A home blood pressure monitor.  Talk to your health care provider about your target blood pressure.  If you are between 22-17 years old, ask your health care provider if you should take aspirin to prevent heart disease.  Have regular diabetes screenings by checking your fasting blood sugar level. ? If you are at a normal weight and have a low risk for diabetes, have this test once every three years after the age of 60. ? If you are overweight and have a high risk for diabetes, consider being tested at a younger age or more often.  A one-time screening for abdominal aortic aneurysm (AAA) by ultrasound is recommended for men aged 46-75 years who are current or former smokers. What should I know about preventing infection? Hepatitis B If you have a higher risk for hepatitis B, you should be screened for this virus. Talk with your health care provider to find out if you are at risk for hepatitis B infection. Hepatitis C Blood testing is recommended for:  Everyone born from [birth year] through 1965.  Anyone with known risk factors for hepatitis C.  Sexually Transmitted Diseases (STDs)  You should be screened each year for STDs including gonorrhea and chlamydia if: ? You are sexually active and are younger than 52 years of age. ? You are older than 52 years of age and your health care provider tells you that you are at risk for this type of infection. ? Your sexual activity has changed since you were last screened and you are at an increased risk for chlamydia or gonorrhea. Ask your health care provider if  you are at risk.  Talk with your health care provider about whether you are at high risk of being infected with HIV. Your health care provider may recommend a prescription medicine to help prevent HIV infection.  What else can I do?  Schedule regular health, dental, and eye exams.  Stay current with your vaccines (immunizations).  Do not use any tobacco products, such as cigarettes, chewing tobacco, and e-cigarettes. If you need help quitting, ask your health care provider.  Limit alcohol intake to no more than 2 drinks per day. One drink equals 12 ounces of beer, 5 ounces of wine, or 1 ounces of hard liquor.  Do not use street drugs.  Do not share needles.  Ask your health care provider for help if you need support or information about quitting drugs.  Tell your health care provider if you often feel depressed.  Tell your health care provider if you have ever been abused or do not feel  safe at home. This information is not intended to replace advice given to you by your health care provider. Make sure you discuss any questions you have with your health care provider. Document Released: 05/25/2008 Document Revised: 07/26/2016 Document Reviewed: 08/31/2015 Elsevier Interactive Patient Education  [employer].

## 2018-09-05 ENCOUNTER — Ambulatory Visit (INDEPENDENT_AMBULATORY_CARE_PROVIDER_SITE_OTHER): Payer: 59 | Admitting: Internal Medicine

## 2018-09-05 ENCOUNTER — Encounter: Payer: Self-pay | Admitting: Internal Medicine

## 2018-09-05 ENCOUNTER — Other Ambulatory Visit (INDEPENDENT_AMBULATORY_CARE_PROVIDER_SITE_OTHER): Payer: 59

## 2018-09-05 VITALS — BP 122/80 | HR 78 | Temp 97.8°F | Ht 67.0 in | Wt 218.0 lb

## 2018-09-05 DIAGNOSIS — Z0000 Encounter for general adult medical examination without abnormal findings: Secondary | ICD-10-CM

## 2018-09-05 DIAGNOSIS — Z23 Encounter for immunization: Secondary | ICD-10-CM

## 2018-09-05 DIAGNOSIS — E782 Mixed hyperlipidemia: Secondary | ICD-10-CM

## 2018-09-05 DIAGNOSIS — K279 Peptic ulcer, site unspecified, unspecified as acute or chronic, without hemorrhage or perforation: Secondary | ICD-10-CM | POA: Insufficient documentation

## 2018-09-05 DIAGNOSIS — E785 Hyperlipidemia, unspecified: Secondary | ICD-10-CM

## 2018-09-05 DIAGNOSIS — K219 Gastro-esophageal reflux disease without esophagitis: Secondary | ICD-10-CM | POA: Diagnosis not present

## 2018-09-05 DIAGNOSIS — Z8711 Personal history of peptic ulcer disease: Secondary | ICD-10-CM | POA: Insufficient documentation

## 2018-09-05 LAB — COMPREHENSIVE METABOLIC PANEL
ALBUMIN: 4.5 g/dL (ref 3.5–5.2)
ALK PHOS: 72 U/L (ref 39–117)
ALT: 51 U/L (ref 0–53)
AST: 38 U/L — ABNORMAL HIGH (ref 0–37)
BUN: 18 mg/dL (ref 6–23)
CALCIUM: 9.8 mg/dL (ref 8.4–10.5)
CHLORIDE: 104 meq/L (ref 96–112)
CO2: 27 mEq/L (ref 19–32)
Creatinine, Ser: 0.96 mg/dL (ref 0.40–1.50)
GFR: 87.41 mL/min (ref >60.00)
Glucose, Bld: 100 mg/dL — ABNORMAL HIGH (ref 70–99)
POTASSIUM: 4.3 meq/L (ref 3.5–5.1)
Sodium: 140 mEq/L (ref 135–145)
TOTAL PROTEIN: 7.3 g/dL (ref 6.0–8.3)
Total Bilirubin: 0.6 mg/dL (ref 0.2–1.2)

## 2018-09-05 LAB — CBC WITH DIFFERENTIAL/PLATELET
BASOS ABS: 0 10*3/uL (ref 0.0–0.1)
Basophils Relative: 0.6 % (ref 0.0–3.0)
EOS ABS: 0.1 10*3/uL (ref 0.0–0.7)
Eosinophils Relative: 1.1 % (ref 0.0–5.0)
HEMATOCRIT: 42.7 % (ref 39.0–52.0)
Hemoglobin: 14.9 g/dL (ref 13.0–17.0)
Lymphocytes Relative: 20.5 % (ref 12.0–46.0)
Lymphs Abs: 1.2 10*3/uL (ref 0.7–4.0)
MCHC: 34.9 g/dL (ref 30.0–36.0)
MCV: 89.6 fl (ref 78.0–100.0)
Monocytes Absolute: 0.6 10*3/uL (ref 0.1–1.0)
Monocytes Relative: 9.5 % (ref 3.0–12.0)
NEUTROS ABS: 4.1 10*3/uL (ref 1.4–7.7)
Neutrophils Relative %: 68.3 % (ref 43.0–77.0)
Platelets: 240 10*3/uL (ref 150.0–400.0)
RBC: 4.77 Mil/uL (ref 4.22–5.81)
RDW: 13.4 % (ref 11.5–15.5)
WBC: 6 10*3/uL (ref 4.0–10.5)

## 2018-09-05 LAB — LIPID PANEL
CHOL/HDL RATIO: 4
CHOLESTEROL: 157 mg/dL (ref 0–200)
HDL: 43.6 mg/dL (ref >39.00)
LDL Cholesterol: 89 mg/dL (ref 0–99)
NONHDL: 113.35
TRIGLYCERIDES: 120 mg/dL (ref 0.0–149.0)
VLDL: 24 mg/dL (ref 0.0–40.0)

## 2018-09-05 LAB — TSH: TSH: 1.94 u[IU]/mL (ref 0.35–4.50)

## 2018-09-05 MED ORDER — SIMVASTATIN 20 MG PO TABS: 20.0000 mg | ORAL_TABLET | Freq: Every day | ORAL | 3 refills | Status: DC

## 2018-09-05 NOTE — Assessment & Plan Note (Signed)
Check lipid panel  Continue daily statin Regular exercise and healthy diet encouraged  

## 2018-09-05 NOTE — Assessment & Plan Note (Signed)
EGD 08/13/18 - mild PUD and gastritis Placed on nexium for 6 weeks ? Related to nsaids, but had not been taking nsaids regularly Advised to stop ASA 81 mg, avoid nsaids Will f/u with GI in one year, sooner if needed

## 2018-09-06 ENCOUNTER — Encounter: Payer: Self-pay | Admitting: Internal Medicine

## 2018-09-06 LAB — PSA, TOTAL AND FREE
PSA, % FREE: 23 % — AB (ref >25)
PSA, FREE: 0.3 ng/mL
PSA, TOTAL: 1.3 ng/mL (ref <=4.0)

## 2018-10-18 DIAGNOSIS — D2261 Melanocytic nevi of right upper limb, including shoulder: Secondary | ICD-10-CM | POA: Diagnosis not present

## 2018-10-18 DIAGNOSIS — D225 Melanocytic nevi of trunk: Secondary | ICD-10-CM | POA: Diagnosis not present

## 2018-10-18 DIAGNOSIS — D2262 Melanocytic nevi of left upper limb, including shoulder: Secondary | ICD-10-CM | POA: Diagnosis not present

## 2019-09-08 ENCOUNTER — Encounter: Payer: Self-pay | Admitting: Internal Medicine

## 2019-09-08 DIAGNOSIS — R739 Hyperglycemia, unspecified: Secondary | ICD-10-CM | POA: Insufficient documentation

## 2019-09-08 NOTE — Progress Notes (Signed)
Subjective:    Patient ID: Ryan Reyes, male    DOB: 21-Mar-1966, 53 y.o.   MRN: [id number]  HPI He is here for a physical exam.    He has pain in his lower back if he tweeks it.  He uses Aspercreme on it and after a few days it is fine.      Medications and allergies reviewed with patient and updated if appropriate.  Patient Active Problem List   Diagnosis Date Noted  . Hyperglycemia 09/08/2019  . PUD (peptic ulcer disease) 09/05/2018  . Spondylosis of lumbar region without myelopathy or radiculopathy 03/08/2018  . GERD (gastroesophageal reflux disease) 08/30/2016  . Bilateral low back pain without sciatica 08/30/2016  . History of Legg-Calve-Perthes disease 08/18/2013  . HYPERLIPIDEMIA 09/06/2007    Current Outpatient Medications on File Prior to Visit  Medication Sig Dispense Refill  . Apple Cider Vinegar 188 MG CAPS Take 376 mg by mouth daily.    . cetirizine (KLS ALLER-TEC) 10 MG tablet Take 10 mg by mouth daily.    Marland Kitchen esomeprazole (NEXIUM) 20 MG capsule Take 20 mg by mouth as needed.     . Multiple Vitamin (MULTIVITAMIN WITH MINERALS) TABS tablet Take 1 tablet by mouth daily.    . simvastatin (ZOCOR) 20 MG tablet Take 1 tablet (20 mg total) by mouth at bedtime. 90 tablet 3   No current facility-administered medications on file prior to visit.     Past Medical History:  Diagnosis Date  . Allergy   . Esophageal reflux disease   . Family history of ischemic heart disease    father @ 42; CVA pre 50  . Hyperlipidemia     Past Surgical History:  Procedure Laterality Date  . APPENDECTOMY    . EYE SURGERY     Eye Trauma age 69-6   . WISDOM TOOTH EXTRACTION      Social History   Socioeconomic History  . Marital status: Married    Spouse name: Not on file  . Number of children: Not on file  . Years of education: Not on file  . Highest education level: Not on file  Occupational History  . Not on file  Social Needs  . Financial resource strain: Not on  file  . Food insecurity    Worry: Not on file    Inability: Not on file  . Transportation needs    Medical: Not on file    Non-medical: Not on file  Tobacco Use  . Smoking status: Never Smoker  . Smokeless tobacco: Never Used  Substance and Sexual Activity  . Alcohol use: Yes    Alcohol/week: 6.0 standard drinks    Types: 6 Cans of beer per week    Comment: occasionally  . Drug use: No  . Sexual activity: Not on file  Lifestyle  . Physical activity    Days per week: Not on file    Minutes per session: Not on file  . Stress: Not on file  Relationships  . Social Herbalist on phone: Not on file    Gets together: Not on file    Attends religious service: Not on file    Active member of club or organization: Not on file    Attends meetings of clubs or organizations: Not on file    Relationship status: Not on file  Other Topics Concern  . Not on file  Social History Narrative   Elliptical - 2-4 times a week, golf  Family History  Problem Relation Age of Onset  . Thrombocytopenia Mother        ITP, S/P splenectomy  . Heart attack Father 19  . Stroke Father        < 44  . Melanoma Sister        1/2 sister  . Diabetes Maternal Grandmother   . Diabetes Other        2 M 1/2 uncles  . Esophageal cancer Other        1/2 brother  . Colon cancer Neg Hx   . Colon polyps Neg Hx   . Rectal cancer Neg Hx   . Stomach cancer Neg Hx   . Breast cancer Neg Hx     Review of Systems  Constitutional: Negative for chills and fever.  HENT: Negative for trouble swallowing.   Eyes: Negative for visual disturbance.  Respiratory: Negative for cough, shortness of breath and wheezing.   Cardiovascular: Negative for chest pain, palpitations and leg swelling.  Gastrointestinal: Negative for abdominal pain, blood in stool, constipation, diarrhea and nausea.       No gerd  Genitourinary: Negative for difficulty urinating, dysuria and hematuria.  Musculoskeletal: Positive for  back pain. Negative for arthralgias.  Skin: Negative for color change and rash.  Neurological: Negative for dizziness, light-headedness, numbness and headaches.  Psychiatric/Behavioral: Negative for dysphoric mood. The patient is not nervous/anxious.        Objective:   Vitals:   09/09/19 0745  BP: 132/80  Pulse: 72  Resp: 16  Temp: 97.9 F (36.6 C)  SpO2: 98%   Filed Weights   09/09/19 0745  Weight: 219 lb (99.3 kg)   Body mass index is 34.3 kg/m.  BP Readings from Last 3 Encounters:  09/09/19 132/80  09/05/18 122/80  08/13/18 133/85    Wt Readings from Last 3 Encounters:  09/09/19 219 lb (99.3 kg)  09/05/18 218 lb (98.9 kg)  08/13/18 220 lb (99.8 kg)     Physical Exam Constitutional: He appears well-developed and well-nourished. No distress.  HENT:  Head: Normocephalic and atraumatic.  Right Ear: External ear normal.  Left Ear: External ear normal.  Mouth/Throat: Oropharynx is clear and moist.  Normal ear canals and TM b/l  Eyes: Conjunctivae and EOM are normal.  Neck: Neck supple. No tracheal deviation present. No thyromegaly present.  No carotid bruit  Cardiovascular: Normal rate, regular rhythm, normal heart sounds and intact distal pulses.   No murmur heard. Pulmonary/Chest: Effort normal and breath sounds normal. No respiratory distress. He has no wheezes. He has no rales.  Abdominal: Soft. He exhibits no distension. There is no tenderness.  Genitourinary: deferred  Musculoskeletal: He exhibits no edema.  Lymphadenopathy:   He has no cervical adenopathy.  Skin: Skin is warm and dry. He is not diaphoretic.  Psychiatric: He has a normal mood and affect. His behavior is normal.         Assessment & Plan:   Physical exam: Screening blood work  ordered Immunizations  Flu vaccine today, td up to date, discussed shingrix Colonoscopy   Up to date but due Dec 2020 Eye exams   Up to date  Exercise   Golf, elliptical, weights Weight  Advised weight  loss - he wants to lose weight  - discussed food changes Substance abuse   none  See Problem List for Assessment and Plan of chronic medical problems.  FU in one year

## 2019-09-08 NOTE — Patient Instructions (Addendum)
Tests ordered today. Your results will be released to [place] (or called to you) after review.  If any changes need to be made, you will be notified at that same time.  All other Health Maintenance issues reviewed.   All recommended immunizations and age-appropriate screenings are up-to-date or discussed.  No immunization today.    Medications reviewed and updated.  Changes include :   none     Please followup in 1 year    Health Maintenance, Male Adopting a healthy lifestyle and getting preventive care are important in promoting health and wellness. Ask your health care provider about:  The right schedule for you to have regular tests and exams.  Things you can do on your own to prevent diseases and keep yourself healthy. What should I know about diet, weight, and exercise? Eat a healthy diet   Eat a diet that includes plenty of vegetables, fruits, low-fat dairy products, and lean protein.  Do not eat a lot of foods that are high in solid fats, added sugars, or sodium. Maintain a healthy weight Body mass index (BMI) is a measurement that can be used to identify possible weight problems. It estimates body fat based on height and weight. Your health care provider can help determine your BMI and help you achieve or maintain a healthy weight. Get regular exercise Get regular exercise. This is one of the most important things you can do for your health. Most adults should:  Exercise for at least 150 minutes each week. The exercise should increase your heart rate and make you sweat (moderate-intensity exercise).  Do strengthening exercises at least twice a week. This is in addition to the moderate-intensity exercise.  Spend less time sitting. Even light physical activity can be beneficial. Watch cholesterol and blood lipids Have your blood tested for lipids and cholesterol at 53 years of age, then have this test every 5 years. You may need to have your cholesterol levels checked  more often if:  Your lipid or cholesterol levels are high.  You are older than 53 years of age.  You are at high risk for heart disease. What should I know about cancer screening? Many types of cancers can be detected early and may often be prevented. Depending on your health history and family history, you may need to have cancer screening at various ages. This may include screening for:  Colorectal cancer.  Prostate cancer.  Skin cancer.  Lung cancer. What should I know about heart disease, diabetes, and high blood pressure? Blood pressure and heart disease  High blood pressure causes heart disease and increases the risk of stroke. This is more likely to develop in people who have high blood pressure readings, are of African descent, or are overweight.  Talk with your health care provider about your target blood pressure readings.  Have your blood pressure checked: ? Every 3-5 years if you are 63-59 years of age. ? Every year if you are 65 years old or older.  If you are between the ages of 20 and 39 and are a current or former smoker, ask your health care provider if you should have a one-time screening for abdominal aortic aneurysm (AAA). Diabetes Have regular diabetes screenings. This checks your fasting blood sugar level. Have the screening done:  Once every three years after age 70 if you are at a normal weight and have a low risk for diabetes.  More often and at a younger age if you are overweight or have a  high risk for diabetes. What should I know about preventing infection? Hepatitis B If you have a higher risk for hepatitis B, you should be screened for this virus. Talk with your health care provider to find out if you are at risk for hepatitis B infection. Hepatitis C Blood testing is recommended for:  Everyone born from [birth year] through 1965.  Anyone with known risk factors for hepatitis C. Sexually transmitted infections (STIs)  You should be screened each  year for STIs, including gonorrhea and chlamydia, if: ? You are sexually active and are younger than 53 years of age. ? You are older than 53 years of age and your health care provider tells you that you are at risk for this type of infection. ? Your sexual activity has changed since you were last screened, and you are at increased risk for chlamydia or gonorrhea. Ask your health care provider if you are at risk.  Ask your health care provider about whether you are at high risk for HIV. Your health care provider may recommend a prescription medicine to help prevent HIV infection. If you choose to take medicine to prevent HIV, you should first get tested for HIV. You should then be tested every 3 months for as long as you are taking the medicine. Follow these instructions at home: Lifestyle  Do not use any products that contain nicotine or tobacco, such as cigarettes, e-cigarettes, and chewing tobacco. If you need help quitting, ask your health care provider.  Do not use street drugs.  Do not share needles.  Ask your health care provider for help if you need support or information about quitting drugs. Alcohol use  Do not drink alcohol if your health care provider tells you not to drink.  If you drink alcohol: ? Limit how much you have to 0-2 drinks a day. ? Be aware of how much alcohol is in your drink. In the [place] one drink equals one 12 oz bottle of beer (355 mL), one 5 oz glass of wine (148 mL), or one 1 oz glass of hard liquor (44 mL). General instructions  Schedule regular health, dental, and eye exams.  Stay current with your vaccines.  Tell your health care provider if: ? You often feel depressed. ? You have ever been abused or do not feel safe at home. Summary  Adopting a healthy lifestyle and getting preventive care are important in promoting health and wellness.  Follow your health care provider's instructions about healthy diet, exercising, and getting tested or  screened for diseases.  Follow your health care provider's instructions on monitoring your cholesterol and blood pressure. This information is not intended to replace advice given to you by your health care provider. Make sure you discuss any questions you have with your health care provider. Document Released: 05/25/2008 Document Revised: 11/20/2018 Document Reviewed: 11/20/2018 Elsevier Patient Education  2020 [employer].

## 2019-09-09 ENCOUNTER — Other Ambulatory Visit (INDEPENDENT_AMBULATORY_CARE_PROVIDER_SITE_OTHER): Payer: 59

## 2019-09-09 ENCOUNTER — Encounter: Payer: Self-pay | Admitting: Internal Medicine

## 2019-09-09 ENCOUNTER — Ambulatory Visit (INDEPENDENT_AMBULATORY_CARE_PROVIDER_SITE_OTHER): Payer: 59 | Admitting: Internal Medicine

## 2019-09-09 ENCOUNTER — Other Ambulatory Visit: Payer: Self-pay

## 2019-09-09 VITALS — BP 132/80 | HR 72 | Temp 97.9°F | Resp 16 | Ht 67.0 in | Wt 219.0 lb

## 2019-09-09 DIAGNOSIS — Z1159 Encounter for screening for other viral diseases: Secondary | ICD-10-CM

## 2019-09-09 DIAGNOSIS — Z0000 Encounter for general adult medical examination without abnormal findings: Secondary | ICD-10-CM

## 2019-09-09 DIAGNOSIS — K279 Peptic ulcer, site unspecified, unspecified as acute or chronic, without hemorrhage or perforation: Secondary | ICD-10-CM

## 2019-09-09 DIAGNOSIS — M545 Low back pain: Secondary | ICD-10-CM

## 2019-09-09 DIAGNOSIS — E782 Mixed hyperlipidemia: Secondary | ICD-10-CM | POA: Diagnosis not present

## 2019-09-09 DIAGNOSIS — K219 Gastro-esophageal reflux disease without esophagitis: Secondary | ICD-10-CM | POA: Diagnosis not present

## 2019-09-09 DIAGNOSIS — R739 Hyperglycemia, unspecified: Secondary | ICD-10-CM

## 2019-09-09 DIAGNOSIS — G8929 Other chronic pain: Secondary | ICD-10-CM

## 2019-09-09 LAB — LIPID PANEL
Cholesterol: 170 mg/dL (ref 0–200)
HDL: 48.9 mg/dL (ref >39.00)
LDL Cholesterol: 87 mg/dL (ref 0–99)
NonHDL: 121.1
Total CHOL/HDL Ratio: 3
Triglycerides: 173 mg/dL — ABNORMAL HIGH (ref 0.0–149.0)
VLDL: 34.6 mg/dL (ref 0.0–40.0)

## 2019-09-09 LAB — CBC WITH DIFFERENTIAL/PLATELET
Basophils Absolute: 0 10*3/uL (ref 0.0–0.1)
Basophils Relative: 0.4 % (ref 0.0–3.0)
Eosinophils Absolute: 0.1 10*3/uL (ref 0.0–0.7)
Eosinophils Relative: 1.8 % (ref 0.0–5.0)
HCT: 45.7 % (ref 39.0–52.0)
Hemoglobin: 15.5 g/dL (ref 13.0–17.0)
Lymphocytes Relative: 22.9 % (ref 12.0–46.0)
Lymphs Abs: 1.4 10*3/uL (ref 0.7–4.0)
MCHC: 33.9 g/dL (ref 30.0–36.0)
MCV: 93.5 fl (ref 78.0–100.0)
Monocytes Absolute: 0.6 10*3/uL (ref 0.1–1.0)
Monocytes Relative: 9.9 % (ref 3.0–12.0)
Neutro Abs: 4.1 10*3/uL (ref 1.4–7.7)
Neutrophils Relative %: 65 % (ref 43.0–77.0)
Platelets: 250 10*3/uL (ref 150.0–400.0)
RBC: 4.89 Mil/uL (ref 4.22–5.81)
RDW: 13 % (ref 11.5–15.5)
WBC: 6.3 10*3/uL (ref 4.0–10.5)

## 2019-09-09 LAB — COMPREHENSIVE METABOLIC PANEL
ALT: 27 U/L (ref 0–53)
AST: 24 U/L (ref 0–37)
Albumin: 4.6 g/dL (ref 3.5–5.2)
Alkaline Phosphatase: 66 U/L (ref 39–117)
BUN: 18 mg/dL (ref 6–23)
CO2: 25 mEq/L (ref 19–32)
Calcium: 10.1 mg/dL (ref 8.4–10.5)
Chloride: 104 mEq/L (ref 96–112)
Creatinine, Ser: 0.98 mg/dL (ref 0.40–1.50)
GFR: 80 mL/min (ref >60.00)
Glucose, Bld: 94 mg/dL (ref 70–99)
Potassium: 4.3 mEq/L (ref 3.5–5.1)
Sodium: 140 mEq/L (ref 135–145)
Total Bilirubin: 0.8 mg/dL (ref 0.2–1.2)
Total Protein: 7.5 g/dL (ref 6.0–8.3)

## 2019-09-09 LAB — TSH: TSH: 1.79 u[IU]/mL (ref 0.35–4.50)

## 2019-09-09 LAB — HEMOGLOBIN A1C: Hgb A1c MFr Bld: 5.4 % (ref 4.6–6.5)

## 2019-09-09 NOTE — Assessment & Plan Note (Signed)
Check lipid panel,cmp ,tsh Continue daily statin Regular exercise and healthy diet encouraged  

## 2019-09-09 NOTE — Assessment & Plan Note (Signed)
Occasionally lower back pain - treated with otc medications and lasts a few days Had xray at [facility] - no concerning findings Recommended back exercises

## 2019-09-09 NOTE — Assessment & Plan Note (Signed)
Check a1c Low sugar / carb diet Stressed regular exercise   

## 2019-09-09 NOTE — Assessment & Plan Note (Signed)
Last EGD 08/2018 - gastritis and small intestinal ulcers likely from ASA 81 mg - he no longer takes that Colonoscopy due this December - will discuss if another EGD is needed with GI then

## 2019-09-09 NOTE — Assessment & Plan Note (Signed)
GERD controlled Continue daily medication  

## 2019-09-10 LAB — PSA, TOTAL AND FREE
PSA, % Free: 29 % (calc) (ref >25)
PSA, Free: 0.4 ng/mL
PSA, Total: 1.4 ng/mL (ref <=4.0)

## 2019-09-10 LAB — SAR COV2 SEROLOGY (COVID19)AB(IGG),IA: SARS CoV2 AB IGG: NEGATIVE

## 2019-09-11 ENCOUNTER — Encounter: Payer: Self-pay | Admitting: Internal Medicine

## 2019-10-05 ENCOUNTER — Other Ambulatory Visit: Payer: Self-pay | Admitting: Internal Medicine

## 2019-10-05 DIAGNOSIS — E785 Hyperlipidemia, unspecified: Secondary | ICD-10-CM

## 2019-11-10 ENCOUNTER — Encounter: Payer: Self-pay | Admitting: Gastroenterology

## 2019-12-16 ENCOUNTER — Encounter: Payer: Self-pay | Admitting: Gastroenterology

## 2020-01-16 ENCOUNTER — Ambulatory Visit (INDEPENDENT_AMBULATORY_CARE_PROVIDER_SITE_OTHER): Payer: 59 | Admitting: Gastroenterology

## 2020-01-16 ENCOUNTER — Encounter: Payer: Self-pay | Admitting: Gastroenterology

## 2020-01-16 VITALS — BP 162/88 | HR 74 | Temp 98.1°F | Ht 67.0 in | Wt 221.0 lb

## 2020-01-16 DIAGNOSIS — K219 Gastro-esophageal reflux disease without esophagitis: Secondary | ICD-10-CM | POA: Diagnosis not present

## 2020-01-16 DIAGNOSIS — Z01818 Encounter for other preprocedural examination: Secondary | ICD-10-CM | POA: Diagnosis not present

## 2020-01-16 DIAGNOSIS — Z86010 Personal history of colonic polyps: Secondary | ICD-10-CM | POA: Diagnosis not present

## 2020-01-16 MED ORDER — SUTAB 1479-225-188 MG PO TABS: 1.0000 | ORAL_TABLET | Freq: Once | ORAL | 0 refills | Status: AC

## 2020-01-16 NOTE — Patient Instructions (Addendum)
If you are age 54 or older, your body mass index should be between 23-30. Your Body mass index is 34.61 kg/m. If this is out of the aforementioned range listed, please consider follow up with your Primary Care Provider.  If you are age 56 or younger, your body mass index should be between 19-25. Your Body mass index is 34.61 kg/m. If this is out of the aformentioned range listed, please consider follow up with your Primary Care Provider.   You have been scheduled for a colonoscopy. Please follow written instructions given to you at your visit today.  Please pick up your prep supplies at the pharmacy within the next 1-3 days. If you use inhalers (even only as needed), please bring them with you on the day of your procedure. Your physician has requested that you go to www.startemmi.com and enter the access code given to you at your visit today. This web site gives a general overview about your procedure. However, you should still follow specific instructions given to you by our office regarding your preparation for the procedure.  We are giving you a Sutab sample today for your colonoscopy prep.  Thank you for entrusting me with your care and for choosing [facility], Dr. Georgetown Cellar   Due to recent changes in healthcare laws, you may see the results of your imaging and laboratory studies on MyChart before your provider has had a chance to review them.  We understand that in some cases there may be results that are confusing or concerning to you. Not all laboratory results come back in the same time frame and the provider may be waiting for multiple results in order to interpret others.  Please give [place] 48 hours in order for your provider to thoroughly review all the results before contacting the office for clarification of your results.

## 2020-01-16 NOTE — Progress Notes (Signed)
HPI :  54 year old male here for follow-up visit for history of reflux and colon polyps.  He has had intermittent reflux symptoms for several years.  His half brother was diagnosed with esophageal cancer at age 60.  We performed an upper endoscopy for him in September 2019.  Results as outlined below.  There was no evidence of any Barrett's esophagus.  He incidentally had a few very superficial duodenal ulcerations noted.  Biopsies of his stomach were negative for H. pylori.  At that time he was not taking Nexium routinely, just as needed.  I had recommended he take Nexium for 6 weeks to heal the ulcers which he did.  He states he is feeling really well since of last seen him.  He denies any significant reflux symptoms that are bothering him.  He is actually taking Nexium 20 mg only as needed, and very seldom at that.  He is using apple cider vinegar which he states works well for him and he denies any complaints with the reflux right now.  No dysphagia.  No nausea or vomiting.  He denies any bowel habit changes.  No family history of colon cancer.  He is known to me from his screening colonoscopy which was done in December 2017. He was found to have 3 small sessile serrated polyps at the time, due for surveillance colonoscopy in 11/2019.  He is actually already scheduled for this later this month.  Endoscopic history: Colonoscopy 11/20/2016 - 3 sessile serrated polyps / adenomas, internal hemorrhoids - surveillance due 11/2019   EGD 08/13/2018 -  Findings: - The exam of the esophagus was otherwise normal. No evidence of Barrett's - A few small erosions were found in the gastric body without overt ulceration. - The exam of the stomach was otherwise normal. - Biopsies were taken with a cold forceps in the gastric body, at the incisura and in the gastric antrum for Helicobacter pylori testing. - A few non-bleeding superficial duodenal ulcers with no stigmata of bleeding were found in the  duodenal bulb. The largest lesion was 7 mm in largest dimension. - The exam of the duodenum was otherwise normal.  H pylori negative.   Past Medical History:  Diagnosis Date  . Allergy   . Esophageal reflux disease   . Family history of ischemic heart disease    father @ 74; CVA pre 59  . Hyperlipidemia      Past Surgical History:  Procedure Laterality Date  . APPENDECTOMY    . EYE SURGERY     Eye Trauma age 63-6   . WISDOM TOOTH EXTRACTION     Family History  Problem Relation Age of Onset  . Thrombocytopenia Mother        ITP, S/P splenectomy  . Heart attack Father 26  . Stroke Father        < 39  . Melanoma Sister        1/2 sister  . Diabetes Maternal Grandmother   . Diabetes Other        2 M 1/2 uncles  . Esophageal cancer Other        1/2 brother  . Colon cancer Neg Hx   . Colon polyps Neg Hx   . Rectal cancer Neg Hx   . Stomach cancer Neg Hx   . Breast cancer Neg Hx    Social History   Tobacco Use  . Smoking status: Never Smoker  . Smokeless tobacco: Never Used  Substance Use Topics  .  Alcohol use: Yes    Alcohol/week: 6.0 standard drinks    Types: 6 Cans of beer per week    Comment: occasionally  . Drug use: No   Current Outpatient Medications  Medication Sig Dispense Refill  . Apple Cider Vinegar 188 MG CAPS Take 376 mg by mouth daily.    Marland Kitchen esomeprazole (NEXIUM) 20 MG capsule Take 20 mg by mouth as needed.     . Multiple Vitamin (MULTIVITAMIN WITH MINERALS) TABS tablet Take 1 tablet by mouth daily.    . simvastatin (ZOCOR) 20 MG tablet TAKE 1 TABLET(20 MG) BY MOUTH AT BEDTIME 90 tablet 3  . cetirizine (KLS ALLER-TEC) 10 MG tablet Take 10 mg by mouth daily.     No current facility-administered medications for this visit.   Allergies  Allergen Reactions  . Aspirin     Gastritis with 325 mg dose He tolerates ECASA 81 mg qd  . Penicillins     ? Reaction as child     Review of Systems: All systems reviewed and negative except where noted in  HPI.   Lab Results  Component Value Date   WBC 6.3 09/09/2019   HGB 15.5 09/09/2019   HCT 45.7 09/09/2019   MCV 93.5 09/09/2019   PLT 250.0 09/09/2019    Lab Results  Component Value Date   CREATININE 0.98 09/09/2019   BUN 18 09/09/2019   NA 140 09/09/2019   K 4.3 09/09/2019   CL 104 09/09/2019   CO2 25 09/09/2019    Lab Results  Component Value Date   ALT 27 09/09/2019   AST 24 09/09/2019   ALKPHOS 66 09/09/2019   BILITOT 0.8 09/09/2019     Physical Exam: BP (!) 162/88 (BP Location: Left Arm, Patient Position: Sitting, Cuff Size: Normal)   Pulse 74   Temp 98.1 F (36.7 C) (Oral)   Ht 5\' 7"  (1.702 m)   Wt 221 lb (100.2 kg)   BMI 34.61 kg/m  Constitutional: Pleasant,well-developed, male in no acute distress. Neurological: Alert and oriented to person place and time. Psychiatric: Normal mood and affect. Behavior is normal.   ASSESSMENT AND PLAN: 54 year old male here for reassessment of the following:  GERD - no evidence of Barrett's esophagus.  Symptoms quite minimal actually over the past year.  Not needing to use Nexium routinely, only as needed and seems sparingly at that.  Agree that he should only use this as needed if symptoms bothering him.  He does not have any evidence of Barrett's esophagus, his risk for esophageal cancer is low.  He can continue to follow-up with me as needed for this issue.  Duodenal ulcers were very superficial and was negative for H. pylori testing.  He has no symptoms, do not feel strongly these need to be followed up endoscopically.  He agreed  History of colon polyps - due for surveillance colonoscopy for history of multiple sessile serrated polyps removed 3 years ago.  Counseled on risk and benefits he wants to proceed.  Further recommendations pending results.   Cellar, MD [facility]

## 2020-01-28 ENCOUNTER — Other Ambulatory Visit: Payer: Self-pay | Admitting: Gastroenterology

## 2020-01-29 LAB — SARS CORONAVIRUS 2 (TAT 6-24 HRS): SARS Coronavirus 2: NEGATIVE

## 2020-02-02 ENCOUNTER — Other Ambulatory Visit: Payer: Self-pay

## 2020-02-02 ENCOUNTER — Encounter: Payer: Self-pay | Admitting: Gastroenterology

## 2020-02-02 ENCOUNTER — Ambulatory Visit (AMBULATORY_SURGERY_CENTER): Payer: 59 | Admitting: Gastroenterology

## 2020-02-02 VITALS — BP 111/65 | HR 66 | Temp 96.6°F | Resp 26 | Ht 67.0 in | Wt 221.0 lb

## 2020-02-02 DIAGNOSIS — K635 Polyp of colon: Secondary | ICD-10-CM | POA: Diagnosis not present

## 2020-02-02 DIAGNOSIS — D120 Benign neoplasm of cecum: Secondary | ICD-10-CM | POA: Diagnosis not present

## 2020-02-02 DIAGNOSIS — Z86010 Personal history of colonic polyps: Secondary | ICD-10-CM

## 2020-02-02 MED ORDER — SODIUM CHLORIDE 0.9 % IV SOLN: 500.0000 mL | Freq: Once | INTRAVENOUS | Status: DC

## 2020-02-02 NOTE — Progress Notes (Signed)
Temp taken by JB VS taken by DT 

## 2020-02-02 NOTE — Op Note (Signed)
[place] Patient Name: Ryan Reyes Procedure Date: 02/02/2020 8:33 AM MRN: [id number] Endoscopist: Remo Lipps P. Havery Moros , MD Age: 54 Referring MD:  Date of Birth: 08/11/1966 Gender: Male Account #: [id number] Procedure:                Colonoscopy Indications:              High risk colon cancer surveillance: Personal                            history of sessile serrated colon polyps (3 removed                            in 2017) Medicines:                Monitored Anesthesia Care Procedure:                Pre-Anesthesia Assessment:                           - Prior to the procedure, a History and Physical                            was performed, and patient medications and                            allergies were reviewed. The patient's tolerance of                            previous anesthesia was also reviewed. The risks                            and benefits of the procedure and the sedation                            options and risks were discussed with the patient.                            All questions were answered, and informed consent                            was obtained. Prior Anticoagulants: The patient has                            taken no previous anticoagulant or antiplatelet                            agents. ASA Grade Assessment: II - A patient with                            mild systemic disease. After reviewing the risks                            and benefits, the patient was deemed in  satisfactory condition to undergo the procedure.                           After obtaining informed consent, the colonoscope                            was passed under direct vision. Throughout the                            procedure, the patient's blood pressure, pulse, and                            oxygen saturations were monitored continuously. The                            Colonoscope was introduced through the anus and                            advanced to the the cecum, identified by                            appendiceal orifice and ileocecal valve. The                            colonoscopy was performed without difficulty. The                            patient tolerated the procedure well. The quality                            of the bowel preparation was good. The ileocecal                            valve, appendiceal orifice, and rectum were                            photographed. Scope In: 8:40:31 AM Scope Out: [id number] AM Scope Withdrawal Time: 0 hours 16 minutes 22 seconds  Total Procedure Duration: 0 hours 18 minutes 13 seconds  Findings:                 The perianal and digital rectal examinations were                            normal.                           A 3 mm polyp was found in the cecum. The polyp was                            sessile. The polyp was removed with a cold snare.                            Resection and retrieval were complete.  Internal hemorrhoids were found during                            retroflexion. The hemorrhoids were moderate.                           The exam was otherwise without abnormality. Complications:            No immediate complications. Estimated blood loss:                            Minimal. Estimated Blood Loss:     Estimated blood loss was minimal. Impression:               - One 3 mm polyp in the cecum, removed with a cold                            snare. Resected and retrieved.                           - Internal hemorrhoids.                           - The examination was otherwise normal. Recommendation:           - Patient has a contact number available for                            emergencies. The signs and symptoms of potential                            delayed complications were discussed with the                            patient. Return to normal activities tomorrow.                             Written discharge instructions were provided to the                            patient.                           - Resume previous diet.                           - Continue present medications.                           - Await pathology results. Remo Lipps P. Havery Moros, MD 02/02/2020 9:02:38 AM This report has been signed electronically.

## 2020-02-02 NOTE — Progress Notes (Signed)
Called to room to assist during endoscopic procedure.  Patient ID and intended procedure confirmed with present staff. Received instructions for my participation in the procedure from the performing physician.  

## 2020-02-02 NOTE — Progress Notes (Signed)
To PACU, VSS. Report to [email or web address] 

## 2020-02-02 NOTE — Patient Instructions (Signed)
Discharged instructions given. Handouts on polyps and hemorrhoids. Resume previous medications. YOU HAD AN ENDOSCOPIC PROCEDURE TODAY AT [facility]:   Refer to the procedure report that was given to you for any specific questions about what was found during the examination.  If the procedure report does not answer your questions, please call your gastroenterologist to clarify.  If you requested that your care partner not be given the details of your procedure findings, then the procedure report has been included in a sealed envelope for you to review at your convenience later.  YOU SHOULD EXPECT: Some feelings of bloating in the abdomen. Passage of more gas than usual.  Walking can help get rid of the air that was put into your GI tract during the procedure and reduce the bloating. If you had a lower endoscopy (such as a colonoscopy or flexible sigmoidoscopy) you may notice spotting of blood in your stool or on the toilet paper. If you underwent a bowel prep for your procedure, you may not have a normal bowel movement for a few days.  Please Note:  You might notice some irritation and congestion in your nose or some drainage.  This is from the oxygen used during your procedure.  There is no need for concern and it should clear up in a day or so.  SYMPTOMS TO REPORT IMMEDIATELY:   Following lower endoscopy (colonoscopy or flexible sigmoidoscopy):  Excessive amounts of blood in the stool  Significant tenderness or worsening of abdominal pains  Swelling of the abdomen that is new, acute  Fever of 100F or higher   For urgent or emergent issues, a gastroenterologist can be reached at any hour by calling [phone].   DIET:  We do recommend a small meal at first, but then you may proceed to your regular diet.  Drink plenty of fluids but you should avoid alcoholic beverages for 24 hours.  ACTIVITY:  You should plan to take it easy for the rest of today and you should NOT DRIVE  or use heavy machinery until tomorrow (because of the sedation medicines used during the test).    FOLLOW UP: Our staff will call the number listed on your records 48-72 hours following your procedure to check on you and address any questions or concerns that you may have regarding the information given to you following your procedure. If we do not reach you, we will leave a message.  We will attempt to reach you two times.  During this call, we will ask if you have developed any symptoms of COVID 19. If you develop any symptoms (ie: fever, flu-like symptoms, shortness of breath, cough etc.) before then, please call [phone].  If you test positive for Covid 19 in the 2 weeks post procedure, please call and report this information to [place].    If any biopsies were taken you will be contacted by phone or by letter within the next 1-3 weeks.  Please call us at [phone] if you have not heard about the biopsies in 3 weeks.    SIGNATURES/CONFIDENTIALITY: You and/or your care partner have signed paperwork which will be entered into your electronic medical record.  These signatures attest to the fact that that the information above on your After Visit Summary has been reviewed and is understood.  Full responsibility of the confidentiality of this discharge information lies with you and/or your care-partner.

## 2020-02-04 ENCOUNTER — Telehealth: Payer: Self-pay

## 2020-02-04 ENCOUNTER — Telehealth: Payer: Self-pay | Admitting: *Deleted

## 2020-02-04 NOTE — Telephone Encounter (Signed)
  Follow up Call-  Call back number 02/02/2020 08/13/2018  Post procedure Call Back phone  # [phone]  Permission to leave phone message Yes Yes  Some recent data might be hidden     Patient questions:  Do you have a fever, pain , or abdominal swelling? No. Pain Score  0 *  Have you tolerated food without any problems? Yes.    Have you been able to return to your normal activities? Yes.    Do you have any questions about your discharge instructions: Diet   No. Medications  No. Follow up visit  No.  Do you have questions or concerns about your Care? No.  Actions: * If pain score is 4 or above: No action needed, pain <4.  1. Have you developed a fever since your procedure? no  2.   Have you had an respiratory symptoms (SOB or cough) since your procedure? no  3.   Have you tested positive for COVID 19 since your procedure no  4.   Have you had any family members/close contacts diagnosed with the COVID 19 since your procedure?  no   If yes to any of these questions please route to Joylene John, RN and Alphonsa Gin, Therapist, sports.

## 2020-02-04 NOTE — Telephone Encounter (Signed)
  Follow up Call-  Call back number 02/02/2020 08/13/2018  Post procedure Call Back phone  # [phone]  Permission to leave phone message Yes Yes  Some recent data might be hidden     Left message

## 2020-02-13 ENCOUNTER — Ambulatory Visit: Payer: 59 | Attending: Internal Medicine

## 2020-02-13 ENCOUNTER — Other Ambulatory Visit: Payer: Self-pay

## 2020-02-13 DIAGNOSIS — Z23 Encounter for immunization: Secondary | ICD-10-CM

## 2020-02-13 NOTE — Progress Notes (Signed)
   Covid-19 Vaccination Clinic  Name:  Ryan Reyes    MRN: [id number] DOB: [birth year]  02/13/2020  Mr. Ryan Reyes was observed post Covid-19 immunization for 15 minutes without incident. He was provided with Vaccine Information Sheet and instruction to access the V-Safe system.   Mr. Ryan Reyes was instructed to call 911 with any severe reactions post vaccine: Marland Kitchen Difficulty breathing  . Swelling of face and throat  . A fast heartbeat  . A bad rash all over body  . Dizziness and weakness

## 2020-03-10 ENCOUNTER — Ambulatory Visit: Payer: 59

## 2020-03-15 ENCOUNTER — Ambulatory Visit: Payer: 59

## 2020-03-16 ENCOUNTER — Ambulatory Visit: Payer: 59 | Attending: Internal Medicine

## 2020-03-16 DIAGNOSIS — Z23 Encounter for immunization: Secondary | ICD-10-CM

## 2020-03-16 NOTE — Progress Notes (Signed)
   Covid-19 Vaccination Clinic  Name:  Ryan Reyes    MRN: [id number] DOB: 1966-03-16  03/16/2020  [name] was observed post Covid-19 immunization for 15 minutes without incident. He was provided with Vaccine Information Sheet and instruction to access the V-Safe system.   [name] was instructed to call 911 with any severe reactions post vaccine: Marland Kitchen Difficulty breathing  . Swelling of face and throat  . A fast heartbeat  . A bad rash all over body  . Dizziness and weakness   Immunizations Administered    Name Date Dose VIS Date Route   Pfizer COVID-19 Vaccine 03/16/2020  1:07 PM 0.3 mL 11/21/2019 Intramuscular   Manufacturer: [place]   Lot: [id number]   [place]: [id number]

## 2020-09-08 NOTE — Progress Notes (Signed)
Subjective:    Patient ID: Ryan Reyes, male    DOB: [birth year], 54 y.o.   MRN: [id number]  HPI He is here for a physical exam.   Has had tingling left posterior neck / lower head.  No pain.   Left medial lower leg turned red - it went away.  It did not itch.    Medications and allergies reviewed with patient and updated if appropriate.  Patient Active Problem List   Diagnosis Date Noted  . Hyperglycemia 09/08/2019  . PUD (peptic ulcer disease) 09/05/2018  . Spondylosis of lumbar region without myelopathy or radiculopathy 03/08/2018  . GERD (gastroesophageal reflux disease) 08/30/2016  . Bilateral low back pain without sciatica 08/30/2016  . History of Legg-Calve-Perthes disease 08/18/2013  . HYPERLIPIDEMIA 09/06/2007    Current Outpatient Medications on File Prior to Visit  Medication Sig Dispense Refill  . Apple Cider Vinegar 188 MG CAPS Take 376 mg by mouth daily.    . cetirizine (KLS ALLER-TEC) 10 MG tablet Take 10 mg by mouth daily.    Marland Kitchen esomeprazole (NEXIUM) 20 MG capsule Take 20 mg by mouth as needed.     . Multiple Vitamin (MULTIVITAMIN WITH MINERALS) TABS tablet Take 1 tablet by mouth daily.    . simvastatin (ZOCOR) 20 MG tablet TAKE 1 TABLET(20 MG) BY MOUTH AT BEDTIME 90 tablet 3   No current facility-administered medications on file prior to visit.    Past Medical History:  Diagnosis Date  . Allergy   . Esophageal reflux disease   . Family history of ischemic heart disease    father @ 23; CVA pre 9  . Hyperlipidemia     Past Surgical History:  Procedure Laterality Date  . APPENDECTOMY    . EYE SURGERY     Eye Trauma age 19-6   . WISDOM TOOTH EXTRACTION      Social History   Socioeconomic History  . Marital status: Married    Spouse name: Not on file  . Number of children: Not on file  . Years of education: Not on file  . Highest education level: Not on file  Occupational History  . Not on file  Tobacco Use  . Smoking status: Never  Smoker  . Smokeless tobacco: Never Used  Vaping Use  . Vaping Use: Never used  Substance and Sexual Activity  . Alcohol use: Yes    Alcohol/week: 6.0 standard drinks    Types: 6 Cans of beer per week    Comment: occasionally  . Drug use: No  . Sexual activity: Not on file  Other Topics Concern  . Not on file  Social History Narrative   Elliptical - 2-4 times a week, golf   Social Determinants of Health   Financial Resource Strain:   . Difficulty of Paying Living Expenses: Not on file  Food Insecurity:   . Worried About Charity fundraiser in the Last Year: Not on file  . Ran Out of Food in the Last Year: Not on file  Transportation Needs:   . Lack of Transportation (Medical): Not on file  . Lack of Transportation (Non-Medical): Not on file  Physical Activity:   . Days of Exercise per Week: Not on file  . Minutes of Exercise per Session: Not on file  Stress:   . Feeling of Stress : Not on file  Social Connections:   . Frequency of Communication with Friends and Family: Not on file  . Frequency of Social Gatherings  with Friends and Family: Not on file  . Attends Religious Services: Not on file  . Active Member of Clubs or Organizations: Not on file  . Attends Archivist Meetings: Not on file  . Marital Status: Not on file    Family History  Problem Relation Age of Onset  . Thrombocytopenia Mother        ITP, S/P splenectomy  . Heart attack Father 49  . Stroke Father        < 45  . Melanoma Sister        1/2 sister  . Diabetes Maternal Grandmother   . Diabetes Other        2 M 1/2 uncles  . Esophageal cancer Other        1/2 brother  . Colon cancer Neg Hx   . Colon polyps Neg Hx   . Rectal cancer Neg Hx   . Stomach cancer Neg Hx   . Breast cancer Neg Hx     Review of Systems  Constitutional: Negative for chills and fever.  Eyes: Negative for visual disturbance.  Respiratory: Negative for cough, shortness of breath and wheezing.   Cardiovascular:  Negative for chest pain, palpitations and leg swelling.  Gastrointestinal: Positive for nausea (occ in am). Negative for abdominal pain, blood in stool, constipation and diarrhea.       GERD occ  Genitourinary: Negative for difficulty urinating, dysuria and hematuria.  Musculoskeletal: Negative for arthralgias and back pain.  Skin: Negative for color change and rash.  Neurological: Negative for dizziness, light-headedness and headaches.  Psychiatric/Behavioral: Negative for dysphoric mood. The patient is not nervous/anxious.        Objective:   Vitals:   09/09/20 0809  BP: 120/82  Pulse: 67  Temp: 98.5 F (36.9 C)  SpO2: 97%   Filed Weights   09/09/20 0809  Weight: 216 lb 6.4 oz (98.2 kg)   Body mass index is 33.89 kg/m.  BP Readings from Last 3 Encounters:  09/09/20 120/82  02/02/20 111/65  01/16/20 (!) 162/88    Wt Readings from Last 3 Encounters:  09/09/20 216 lb 6.4 oz (98.2 kg)  02/02/20 221 lb (100.2 kg)  01/16/20 221 lb (100.2 kg)     Physical Exam Constitutional: He appears well-developed and well-nourished. No distress.  HENT:  Head: Normocephalic and atraumatic.  Right Ear: External ear normal.  Left Ear: External ear normal.  Mouth/Throat: Oropharynx is clear and moist.  Normal ear canals and TM b/l  Eyes: Conjunctivae and EOM are normal.  Neck: Neck supple. No tracheal deviation present. No thyromegaly present.  No carotid bruit  Cardiovascular: Normal rate, regular rhythm, normal heart sounds and intact distal pulses.   No murmur heard. Pulmonary/Chest: Effort normal and breath sounds normal. No respiratory distress. He has no wheezes. He has no rales.  Abdominal: Soft. He exhibits no distension. There is no tenderness.  Genitourinary: deferred  Musculoskeletal: He exhibits no edema.  Lymphadenopathy:   He has no cervical adenopathy.  Skin: Skin is warm and dry. He is not diaphoretic.  Psychiatric: He has a normal mood and affect. His behavior  is normal.         Assessment & Plan:   Physical exam: Screening blood work  ordered Immunizations  Flu vaccine at work, tdap advised - will get at a later date, discussed shingrix Colonoscopy   Up to date  Eye exams   Up to date  Exercise   Elliptical, weights, golf, [place]  Weight  advised weight loss Substance abuse   none  See Problem List for Assessment and Plan of chronic medical problems.   This visit occurred during the SARS-CoV-2 public health emergency.  Safety protocols were in place, including screening questions prior to the visit, additional usage of staff PPE, and extensive cleaning of exam room while observing appropriate contact time as indicated for disinfecting solutions.

## 2020-09-08 NOTE — Patient Instructions (Addendum)
Blood work was ordered.    All other Health Maintenance issues reviewed.   All recommended immunizations and age-appropriate screenings are up-to-date or discussed.  No immunization administered today.   Medications reviewed and updated.  Changes include :   none  Your prescription(s) have been submitted to your pharmacy. Please take as directed and contact our office if you believe you are having problem(s) with the medication(s).     Please followup in 1 year    Health Maintenance, Male Adopting a healthy lifestyle and getting preventive care are important in promoting health and wellness. Ask your health care provider about:  The right schedule for you to have regular tests and exams.  Things you can do on your own to prevent diseases and keep yourself healthy. What should I know about diet, weight, and exercise? Eat a healthy diet   Eat a diet that includes plenty of vegetables, fruits, low-fat dairy products, and lean protein.  Do not eat a lot of foods that are high in solid fats, added sugars, or sodium. Maintain a healthy weight Body mass index (BMI) is a measurement that can be used to identify possible weight problems. It estimates body fat based on height and weight. Your health care provider can help determine your BMI and help you achieve or maintain a healthy weight. Get regular exercise Get regular exercise. This is one of the most important things you can do for your health. Most adults should:  Exercise for at least 150 minutes each week. The exercise should increase your heart rate and make you sweat (moderate-intensity exercise).  Do strengthening exercises at least twice a week. This is in addition to the moderate-intensity exercise.  Spend less time sitting. Even light physical activity can be beneficial. Watch cholesterol and blood lipids Have your blood tested for lipids and cholesterol at 54 years of age, then have this test every 5 years. You may  need to have your cholesterol levels checked more often if:  Your lipid or cholesterol levels are high.  You are older than 54 years of age.  You are at high risk for heart disease. What should I know about cancer screening? Many types of cancers can be detected early and may often be prevented. Depending on your health history and family history, you may need to have cancer screening at various ages. This may include screening for:  Colorectal cancer.  Prostate cancer.  Skin cancer.  Lung cancer. What should I know about heart disease, diabetes, and high blood pressure? Blood pressure and heart disease  High blood pressure causes heart disease and increases the risk of stroke. This is more likely to develop in people who have high blood pressure readings, are of African descent, or are overweight.  Talk with your health care provider about your target blood pressure readings.  Have your blood pressure checked: ? Every 3-5 years if you are 18-39 years of age. ? Every year if you are 40 years old or older.  If you are between the ages of 65 and 75 and are a current or former smoker, ask your health care provider if you should have a one-time screening for abdominal aortic aneurysm (AAA). Diabetes Have regular diabetes screenings. This checks your fasting blood sugar level. Have the screening done:  Once every three years after age 45 if you are at a normal weight and have a low risk for diabetes.  More often and at a younger age if you are overweight or have   a high risk for diabetes. What should I know about preventing infection? Hepatitis B If you have a higher risk for hepatitis B, you should be screened for this virus. Talk with your health care provider to find out if you are at risk for hepatitis B infection. Hepatitis C Blood testing is recommended for:  Everyone born from 1945 through 1965.  Anyone with known risk factors for hepatitis C. Sexually transmitted  infections (STIs)  You should be screened each year for STIs, including gonorrhea and chlamydia, if: ? You are sexually active and are younger than 54 years of age. ? You are older than 54 years of age and your health care provider tells you that you are at risk for this type of infection. ? Your sexual activity has changed since you were last screened, and you are at increased risk for chlamydia or gonorrhea. Ask your health care provider if you are at risk.  Ask your health care provider about whether you are at high risk for HIV. Your health care provider may recommend a prescription medicine to help prevent HIV infection. If you choose to take medicine to prevent HIV, you should first get tested for HIV. You should then be tested every 3 months for as long as you are taking the medicine. Follow these instructions at home: Lifestyle  Do not use any products that contain nicotine or tobacco, such as cigarettes, e-cigarettes, and chewing tobacco. If you need help quitting, ask your health care provider.  Do not use street drugs.  Do not share needles.  Ask your health care provider for help if you need support or information about quitting drugs. Alcohol use  Do not drink alcohol if your health care provider tells you not to drink.  If you drink alcohol: ? Limit how much you have to 0-2 drinks a day. ? Be aware of how much alcohol is in your drink. In the [place] one drink equals one 12 oz bottle of beer (355 mL), one 5 oz glass of wine (148 mL), or one 1 oz glass of hard liquor (44 mL). General instructions  Schedule regular health, dental, and eye exams.  Stay current with your vaccines.  Tell your health care provider if: ? You often feel depressed. ? You have ever been abused or do not feel safe at home. Summary  Adopting a healthy lifestyle and getting preventive care are important in promoting health and wellness.  Follow your health care provider's instructions about  healthy diet, exercising, and getting tested or screened for diseases.  Follow your health care provider's instructions on monitoring your cholesterol and blood pressure. This information is not intended to replace advice given to you by your health care provider. Make sure you discuss any questions you have with your health care provider. Document Revised: 11/20/2018 Document Reviewed: 11/20/2018 Elsevier Patient Education  2020 [employer]  

## 2020-09-09 ENCOUNTER — Encounter: Payer: Self-pay | Admitting: Internal Medicine

## 2020-09-09 ENCOUNTER — Ambulatory Visit (INDEPENDENT_AMBULATORY_CARE_PROVIDER_SITE_OTHER): Payer: 59 | Admitting: Internal Medicine

## 2020-09-09 ENCOUNTER — Other Ambulatory Visit: Payer: Self-pay

## 2020-09-09 VITALS — BP 120/82 | HR 67 | Temp 98.5°F | Wt 216.4 lb

## 2020-09-09 DIAGNOSIS — R739 Hyperglycemia, unspecified: Secondary | ICD-10-CM | POA: Diagnosis not present

## 2020-09-09 DIAGNOSIS — Z125 Encounter for screening for malignant neoplasm of prostate: Secondary | ICD-10-CM

## 2020-09-09 DIAGNOSIS — K219 Gastro-esophageal reflux disease without esophagitis: Secondary | ICD-10-CM | POA: Diagnosis not present

## 2020-09-09 DIAGNOSIS — E782 Mixed hyperlipidemia: Secondary | ICD-10-CM

## 2020-09-09 DIAGNOSIS — Z0000 Encounter for general adult medical examination without abnormal findings: Secondary | ICD-10-CM | POA: Diagnosis not present

## 2020-09-09 DIAGNOSIS — E785 Hyperlipidemia, unspecified: Secondary | ICD-10-CM

## 2020-09-09 MED ORDER — SIMVASTATIN 20 MG PO TABS: ORAL_TABLET | ORAL | 3 refills | Status: DC

## 2020-09-09 NOTE — Assessment & Plan Note (Signed)
Chronic Check lipid panel  Continue simvastatin 20 mg daily Regular exercise and healthy diet encouraged  

## 2020-09-09 NOTE — Assessment & Plan Note (Signed)
Chronic GERD controlled Continue otc nexium as needed

## 2020-09-09 NOTE — Addendum Note (Signed)
Addended by: Cresenciano Lick on: 09/09/2020 08:38 AM   Modules accepted: Orders

## 2020-09-09 NOTE — Assessment & Plan Note (Signed)
Chronic Check a1c Low sugar / carb diet Stressed regular exercise  

## 2020-09-10 LAB — CBC WITH DIFFERENTIAL/PLATELET
Absolute Monocytes: 660 cells/uL (ref 200–950)
Basophils Absolute: 30 cells/uL (ref 0–200)
Basophils Relative: 0.5 %
Eosinophils Absolute: 102 cells/uL (ref 15–500)
Eosinophils Relative: 1.7 %
HCT: 45.8 % (ref 38.5–50.0)
Hemoglobin: 15.5 g/dL (ref 13.2–17.1)
Lymphs Abs: 1572 cells/uL (ref 850–3900)
MCH: 31.8 pg (ref 27.0–33.0)
MCHC: 33.8 g/dL (ref 32.0–36.0)
MCV: 94 fL (ref 80.0–100.0)
MPV: 9.8 fL (ref 7.5–12.5)
Monocytes Relative: 11 %
Neutro Abs: 3636 cells/uL (ref 1500–7800)
Neutrophils Relative %: 60.6 %
Platelets: 245 10*3/uL (ref 140–400)
RBC: 4.87 10*6/uL (ref 4.20–5.80)
RDW: 12.7 % (ref 11.0–15.0)
Total Lymphocyte: 26.2 %
WBC: 6 10*3/uL (ref 3.8–10.8)

## 2020-09-10 LAB — TSH: TSH: 1.79 mIU/L (ref 0.40–4.50)

## 2020-09-10 LAB — LIPID PANEL
Cholesterol: 195 mg/dL (ref <200)
HDL: 51 mg/dL (ref >=40)
LDL Cholesterol (Calc): 120 mg/dL (calc) — ABNORMAL HIGH
Non-HDL Cholesterol (Calc): 144 mg/dL (calc) — ABNORMAL HIGH (ref <130)
Total CHOL/HDL Ratio: 3.8 (calc) (ref <5.0)
Triglycerides: 128 mg/dL (ref <150)

## 2020-09-10 LAB — COMPREHENSIVE METABOLIC PANEL
AG Ratio: 1.8 (calc) (ref 1.0–2.5)
ALT: 31 U/L (ref 9–46)
AST: 23 U/L (ref 10–35)
Albumin: 4.6 g/dL (ref 3.6–5.1)
Alkaline phosphatase (APISO): 69 U/L (ref 35–144)
BUN: 15 mg/dL (ref 7–25)
CO2: 26 mmol/L (ref 20–32)
Calcium: 9.9 mg/dL (ref 8.6–10.3)
Chloride: 104 mmol/L (ref 98–110)
Creat: 0.89 mg/dL (ref 0.70–1.33)
Globulin: 2.6 g/dL (calc) (ref 1.9–3.7)
Glucose, Bld: 97 mg/dL (ref 65–99)
Potassium: 4.6 mmol/L (ref 3.5–5.3)
Sodium: 141 mmol/L (ref 135–146)
Total Bilirubin: 0.6 mg/dL (ref 0.2–1.2)
Total Protein: 7.2 g/dL (ref 6.1–8.1)

## 2020-09-10 LAB — PSA, TOTAL AND FREE
PSA, % Free: 23 % (calc) — ABNORMAL LOW (ref >25)
PSA, Free: 0.3 ng/mL
PSA, Total: 1.3 ng/mL (ref <=4.0)

## 2021-03-21 ENCOUNTER — Ambulatory Visit (INDEPENDENT_AMBULATORY_CARE_PROVIDER_SITE_OTHER): Payer: 59

## 2021-03-21 ENCOUNTER — Other Ambulatory Visit: Payer: Self-pay

## 2021-03-21 DIAGNOSIS — Z23 Encounter for immunization: Secondary | ICD-10-CM

## 2021-03-21 NOTE — Progress Notes (Signed)
Pt here for TDap injection. IM given in left deltoid Pt tolerated well.

## 2021-09-12 NOTE — Progress Notes (Signed)
Subjective:    Patient ID: Ryan Reyes, male    DOB: July 26, 1966, 55 y.o.   MRN: [id number]   This visit occurred during the SARS-CoV-2 public health emergency.  Safety protocols were in place, including screening questions prior to the visit, additional usage of staff PPE, and extensive cleaning of exam room while observing appropriate contact time as indicated for disinfecting solutions.   HPI He is here for a physical exam.   His BP was elevated at his [facility] CPE this spring.  He has been watching it at home and it has been good.   He is concerned about his weight.   Medications and allergies reviewed with patient and updated if appropriate.  Patient Active Problem List   Diagnosis Date Noted   Hyperglycemia 09/08/2019   PUD (peptic ulcer disease) 09/05/2018   Spondylosis of lumbar region without myelopathy or radiculopathy 03/08/2018   GERD (gastroesophageal reflux disease) 08/30/2016   Bilateral low back pain without sciatica 08/30/2016   History of Legg-Calve-Perthes disease 08/18/2013   HYPERLIPIDEMIA 09/06/2007    Current Outpatient Medications on File Prior to Visit  Medication Sig Dispense Refill   Apple Cider Vinegar 188 MG CAPS Take 376 mg by mouth daily.     cetirizine (ZYRTEC) 10 MG tablet Take 10 mg by mouth daily.     esomeprazole (NEXIUM) 20 MG capsule Take 20 mg by mouth as needed.      Multiple Vitamin (MULTIVITAMIN WITH MINERALS) TABS tablet Take 1 tablet by mouth daily.     No current facility-administered medications on file prior to visit.    Past Medical History:  Diagnosis Date   Allergy    Esophageal reflux disease    Family history of ischemic heart disease    father @ 25; CVA pre 70   Hyperlipidemia     Past Surgical History:  Procedure Laterality Date   APPENDECTOMY     EYE SURGERY     Eye Trauma age 68-6    WISDOM TOOTH EXTRACTION      Social History   Socioeconomic History   Marital status: Married    Spouse name: Not on  file   Number of children: Not on file   Years of education: Not on file   Highest education level: Not on file  Occupational History   Not on file  Tobacco Use   Smoking status: Never   Smokeless tobacco: Never  Vaping Use   Vaping Use: Never used  Substance and Sexual Activity   Alcohol use: Yes    Alcohol/week: 6.0 standard drinks    Types: 6 Cans of beer per week    Comment: occasionally   Drug use: No   Sexual activity: Not on file  Other Topics Concern   Not on file  Social History Narrative   Elliptical - 2-4 times a week, golf   Social Determinants of Health   Financial Resource Strain: Not on file  Food Insecurity: Not on file  Transportation Needs: Not on file  Physical Activity: Not on file  Stress: Not on file  Social Connections: Not on file    Family History  Problem Relation Age of Onset   Thrombocytopenia Mother        ITP, S/P splenectomy   Heart attack Father 102   Stroke Father        < 83   Melanoma Sister        1/2 sister   Diabetes Maternal Grandmother  Diabetes Other        2 M 1/2 uncles   Esophageal cancer Other        1/2 brother   Colon cancer Neg Hx    Colon polyps Neg Hx    Rectal cancer Neg Hx    Stomach cancer Neg Hx    Breast cancer Neg Hx     Review of Systems  Constitutional:  Negative for chills and fever.  Eyes:  Negative for visual disturbance.  Respiratory:  Negative for cough, shortness of breath and wheezing.   Cardiovascular:  Positive for leg swelling (occ). Negative for chest pain and palpitations.  Gastrointestinal:  Negative for abdominal pain, blood in stool, constipation, diarrhea and nausea.  Genitourinary:  Negative for difficulty urinating, dysuria and hematuria.  Musculoskeletal:  Positive for back pain (occ). Negative for arthralgias.  Skin:  Negative for rash.  Neurological:  Negative for light-headedness and headaches.  Psychiatric/Behavioral:  Negative for dysphoric mood. The patient is not  nervous/anxious.       Objective:   Vitals:   09/13/21 0750  BP: 127/74  Pulse: 68  Temp: 98.1 F (36.7 C)  SpO2: 98%   Filed Weights   09/13/21 0750  Weight: 217 lb (98.4 kg)   Body mass index is 33.99 kg/m.  BP Readings from Last 3 Encounters:  09/13/21 127/74  09/09/20 120/82  02/02/20 111/65    Wt Readings from Last 3 Encounters:  09/13/21 217 lb (98.4 kg)  09/09/20 216 lb 6.4 oz (98.2 kg)  02/02/20 221 lb (100.2 kg)    Depression screen [facility] 2/9 09/13/2021 09/09/2020 09/09/2019 09/05/2018 09/05/2018  Decreased Interest 0 0 0 0 0  Down, Depressed, Hopeless 0 0 0 0 0  PHQ - 2 Score 0 0 0 0 0  Altered sleeping 1 - - 0 -  Tired, decreased energy 0 - - 0 -  Change in appetite 0 - - 0 -  Feeling bad or failure about yourself  0 - - 0 -  Trouble concentrating 0 - - 0 -  Moving slowly or fidgety/restless 0 - - 0 -  Suicidal thoughts 0 - - 0 -  PHQ-9 Score 1 - - 0 -    GAD 7 : Generalized Anxiety Score 09/13/2021  Nervous, Anxious, on Edge 1  Control/stop worrying 0  Worry too much - different things 0  Trouble relaxing 0  Restless 0  Easily annoyed or irritable 0  Afraid - awful might happen 0  Total GAD 7 Score 1       Physical Exam Constitutional: He appears well-developed and well-nourished. No distress.  HENT:  Head: Normocephalic and atraumatic.  Right Ear: External ear normal.  Left Ear: External ear normal.  Mouth/Throat: Oropharynx is clear and moist.  Normal ear canals and TM b/l  Eyes: Conjunctivae and EOM are normal.  Neck: Neck supple. No tracheal deviation present. No thyromegaly present.  No carotid bruit  Cardiovascular: Normal rate, regular rhythm, normal heart sounds and intact distal pulses.   No murmur heard. Pulmonary/Chest: Effort normal and breath sounds normal. No respiratory distress. He has no wheezes. He has no rales.  Abdominal: Soft. He exhibits no distension. There is no tenderness.  Genitourinary: deferred   Musculoskeletal: He exhibits no edema.  Lymphadenopathy:   He has no cervical adenopathy.  Skin: Skin is warm and dry. He is not diaphoretic.  Psychiatric: He has a normal mood and affect. His behavior is normal.  Assessment & Plan:   Physical exam: Screening blood work  ordered Exercise   [place], walking -- not consistent Weight  encouraged weight loss - discussed options for programs that may help Substance abuse   none   Screened for depression using the PHQ 9 scale.  No evidence of depression.    Screened for anxiety using GAD7 Scale.  No evidence of anxiety.   Reviewed recommended immunizations.   Shingles vaccine today   Health Maintenance  Topic Date Due   Zoster Vaccines- Shingrix (1 of 2) Never done   COVID-19 Vaccine (4 - Booster for Pfizer series) 09/29/2021 (Originally 02/09/2021)   INFLUENZA VACCINE  03/10/2022 (Originally 07/11/2021)   COLONOSCOPY (Pts 45-20yrs Insurance coverage will need to be confirmed)  02/01/2027   TETANUS/TDAP  03/22/2031   Hepatitis C Screening  Completed   HIV Screening  Completed   HPV VACCINES  Aged Out     See Problem List for Assessment and Plan of chronic medical problems.

## 2021-09-12 NOTE — Patient Instructions (Addendum)
[facility] with [facility].   Shingles vaccine given today.   Blood work was ordered.     Medications changes include :   none  Your prescription(s) have been submitted to your pharmacy. Please take as directed and contact our office if you believe you are having problem(s) with the medication(s).    Please followup in 1 year   Health Maintenance, Male Adopting a healthy lifestyle and getting preventive care are important in promoting health and wellness. Ask your health care provider about: The right schedule for you to have regular tests and exams. Things you can do on your own to prevent diseases and keep yourself healthy. What should I know about diet, weight, and exercise? Eat a healthy diet  Eat a diet that includes plenty of vegetables, fruits, low-fat dairy products, and lean protein. Do not eat a lot of foods that are high in solid fats, added sugars, or sodium. Maintain a healthy weight Body mass index (BMI) is a measurement that can be used to identify possible weight problems. It estimates body fat based on height and weight. Your health care provider can help determine your BMI and help you achieve or maintain a healthy weight. Get regular exercise Get regular exercise. This is one of the most important things you can do for your health. Most adults should: Exercise for at least 150 minutes each week. The exercise should increase your heart rate and make you sweat (moderate-intensity exercise). Do strengthening exercises at least twice a week. This is in addition to the moderate-intensity exercise. Spend less time sitting. Even light physical activity can be beneficial. Watch cholesterol and blood lipids Have your blood tested for lipids and cholesterol at 55 years of age, then have this test every 5 years. You may need to have your cholesterol levels checked more often if: Your lipid or cholesterol levels are high. You are older than 55 years  of age. You are at high risk for heart disease. What should I know about cancer screening? Many types of cancers can be detected early and may often be prevented. Depending on your health history and family history, you may need to have cancer screening at various ages. This may include screening for: Colorectal cancer. Prostate cancer. Skin cancer. Lung cancer. What should I know about heart disease, diabetes, and high blood pressure? Blood pressure and heart disease High blood pressure causes heart disease and increases the risk of stroke. This is more likely to develop in people who have high blood pressure readings, are of African descent, or are overweight. Talk with your health care provider about your target blood pressure readings. Have your blood pressure checked: Every 3-5 years if you are 47-70 years of age. Every year if you are 63 years old or older. If you are between the ages of 13 and 51 and are a current or former smoker, ask your health care provider if you should have a one-time screening for abdominal aortic aneurysm (AAA). Diabetes Have regular diabetes screenings. This checks your fasting blood sugar level. Have the screening done: Once every three years after age 80 if you are at a normal weight and have a low risk for diabetes. More often and at a younger age if you are overweight or have a high risk for diabetes. What should I know about preventing infection? Hepatitis B If you have a higher risk for hepatitis B, you should be screened for this virus. Talk with your health care provider  to find out if you are at risk for hepatitis B infection. Hepatitis C Blood testing is recommended for: Everyone born from [birth year] through 1965. Anyone with known risk factors for hepatitis C. Sexually transmitted infections (STIs) You should be screened each year for STIs, including gonorrhea and chlamydia, if: You are sexually active and are younger than 55 years of age. You are  older than 55 years of age and your health care provider tells you that you are at risk for this type of infection. Your sexual activity has changed since you were last screened, and you are at increased risk for chlamydia or gonorrhea. Ask your health care provider if you are at risk. Ask your health care provider about whether you are at high risk for HIV. Your health care provider may recommend a prescription medicine to help prevent HIV infection. If you choose to take medicine to prevent HIV, you should first get tested for HIV. You should then be tested every 3 months for as long as you are taking the medicine. Follow these instructions at home: Lifestyle Do not use any products that contain nicotine or tobacco, such as cigarettes, e-cigarettes, and chewing tobacco. If you need help quitting, ask your health care provider. Do not use street drugs. Do not share needles. Ask your health care provider for help if you need support or information about quitting drugs. Alcohol use Do not drink alcohol if your health care provider tells you not to drink. If you drink alcohol: Limit how much you have to 0-2 drinks a day. Be aware of how much alcohol is in your drink. In the [place] one drink equals one 12 oz bottle of beer (355 mL), one 5 oz glass of wine (148 mL), or one 1 oz glass of hard liquor (44 mL). General instructions Schedule regular health, dental, and eye exams. Stay current with your vaccines. Tell your health care provider if: You often feel depressed. You have ever been abused or do not feel safe at home. Summary Adopting a healthy lifestyle and getting preventive care are important in promoting health and wellness. Follow your health care provider's instructions about healthy diet, exercising, and getting tested or screened for diseases. Follow your health care provider's instructions on monitoring your cholesterol and blood pressure. This information is not intended to replace  advice given to you by your health care provider. Make sure you discuss any questions you have with your health care provider. Document Revised: 02/04/2021 Document Reviewed: 11/20/2018 Elsevier Patient Education  2022 [employer].

## 2021-09-13 ENCOUNTER — Ambulatory Visit (INDEPENDENT_AMBULATORY_CARE_PROVIDER_SITE_OTHER): Payer: 59 | Admitting: Internal Medicine

## 2021-09-13 ENCOUNTER — Other Ambulatory Visit: Payer: Self-pay

## 2021-09-13 ENCOUNTER — Encounter: Payer: Self-pay | Admitting: Internal Medicine

## 2021-09-13 VITALS — BP 127/74 | HR 68 | Temp 98.1°F | Ht 67.0 in | Wt 217.0 lb

## 2021-09-13 DIAGNOSIS — E782 Mixed hyperlipidemia: Secondary | ICD-10-CM | POA: Diagnosis not present

## 2021-09-13 DIAGNOSIS — Z125 Encounter for screening for malignant neoplasm of prostate: Secondary | ICD-10-CM

## 2021-09-13 DIAGNOSIS — E785 Hyperlipidemia, unspecified: Secondary | ICD-10-CM

## 2021-09-13 DIAGNOSIS — Z1331 Encounter for screening for depression: Secondary | ICD-10-CM

## 2021-09-13 DIAGNOSIS — Z23 Encounter for immunization: Secondary | ICD-10-CM | POA: Diagnosis not present

## 2021-09-13 DIAGNOSIS — Z1389 Encounter for screening for other disorder: Secondary | ICD-10-CM | POA: Diagnosis not present

## 2021-09-13 DIAGNOSIS — K219 Gastro-esophageal reflux disease without esophagitis: Secondary | ICD-10-CM | POA: Diagnosis not present

## 2021-09-13 DIAGNOSIS — R739 Hyperglycemia, unspecified: Secondary | ICD-10-CM

## 2021-09-13 DIAGNOSIS — E669 Obesity, unspecified: Secondary | ICD-10-CM

## 2021-09-13 DIAGNOSIS — Z0000 Encounter for general adult medical examination without abnormal findings: Secondary | ICD-10-CM | POA: Diagnosis not present

## 2021-09-13 LAB — COMPREHENSIVE METABOLIC PANEL
ALT: 32 U/L (ref 0–53)
AST: 26 U/L (ref 0–37)
Albumin: 4.5 g/dL (ref 3.5–5.2)
Alkaline Phosphatase: 73 U/L (ref 39–117)
BUN: 14 mg/dL (ref 6–23)
CO2: 28 mEq/L (ref 19–32)
Calcium: 9.8 mg/dL (ref 8.4–10.5)
Chloride: 102 mEq/L (ref 96–112)
Creatinine, Ser: 0.89 mg/dL (ref 0.40–1.50)
GFR: 96.78 mL/min (ref >60.00)
Glucose, Bld: 93 mg/dL (ref 70–99)
Potassium: 4.1 mEq/L (ref 3.5–5.1)
Sodium: 139 mEq/L (ref 135–145)
Total Bilirubin: 0.8 mg/dL (ref 0.2–1.2)
Total Protein: 7.3 g/dL (ref 6.0–8.3)

## 2021-09-13 LAB — CBC WITH DIFFERENTIAL/PLATELET
Basophils Absolute: 0 10*3/uL (ref 0.0–0.1)
Basophils Relative: 0.6 % (ref 0.0–3.0)
Eosinophils Absolute: 0.1 10*3/uL (ref 0.0–0.7)
Eosinophils Relative: 2.6 % (ref 0.0–5.0)
HCT: 44 % (ref 39.0–52.0)
Hemoglobin: 14.8 g/dL (ref 13.0–17.0)
Lymphocytes Relative: 29.7 % (ref 12.0–46.0)
Lymphs Abs: 1.5 10*3/uL (ref 0.7–4.0)
MCHC: 33.5 g/dL (ref 30.0–36.0)
MCV: 92.3 fl (ref 78.0–100.0)
Monocytes Absolute: 0.6 10*3/uL (ref 0.1–1.0)
Monocytes Relative: 11.1 % (ref 3.0–12.0)
Neutro Abs: 2.9 10*3/uL (ref 1.4–7.7)
Neutrophils Relative %: 56 % (ref 43.0–77.0)
Platelets: 238 10*3/uL (ref 150.0–400.0)
RBC: 4.77 Mil/uL (ref 4.22–5.81)
RDW: 13.1 % (ref 11.5–15.5)
WBC: 5.2 10*3/uL (ref 4.0–10.5)

## 2021-09-13 LAB — TSH: TSH: 2.63 u[IU]/mL (ref 0.35–5.50)

## 2021-09-13 LAB — LIPID PANEL
Cholesterol: 164 mg/dL (ref 0–200)
HDL: 49.4 mg/dL (ref >39.00)
LDL Cholesterol: 83 mg/dL (ref 0–99)
NonHDL: 114.39
Total CHOL/HDL Ratio: 3
Triglycerides: 156 mg/dL — ABNORMAL HIGH (ref 0.0–149.0)
VLDL: 31.2 mg/dL (ref 0.0–40.0)

## 2021-09-13 LAB — HEMOGLOBIN A1C: Hgb A1c MFr Bld: 5.5 % (ref 4.6–6.5)

## 2021-09-13 MED ORDER — SIMVASTATIN 20 MG PO TABS: ORAL_TABLET | ORAL | 3 refills | Status: DC

## 2021-09-13 NOTE — Addendum Note (Signed)
Addended by: Jacobo Forest on: 09/13/2021 08:48 AM   Modules accepted: Orders

## 2021-09-13 NOTE — Assessment & Plan Note (Signed)
Chronic GERD controlled Continue nexium 20 mg daily prn

## 2021-09-13 NOTE — Assessment & Plan Note (Signed)
Chronic Check lipid panel  Continue simvastatin 20 mg daily Regular exercise and healthy diet encouraged  

## 2021-09-13 NOTE — Addendum Note (Signed)
Addended by: Marcina Millard on: 09/13/2021 08:51 AM   Modules accepted: Orders

## 2021-09-13 NOTE — Assessment & Plan Note (Signed)
Chronic Check a1c Low sugar / carb diet Stressed regular exercise  

## 2021-09-16 LAB — PSA, TOTAL AND FREE
PSA, % Free: 19 % (calc) — ABNORMAL LOW (ref >25)
PSA, Free: 0.3 ng/mL
PSA, Total: 1.6 ng/mL (ref <=4.0)

## 2021-09-16 LAB — TESTOSTERONE, FREE & TOTAL
Free Testosterone: 54.9 pg/mL (ref 35.0–155.0)
Testosterone, Total, LC-MS-MS: 352 ng/dL (ref 250–1100)

## 2022-01-12 ENCOUNTER — Ambulatory Visit (INDEPENDENT_AMBULATORY_CARE_PROVIDER_SITE_OTHER): Payer: 59

## 2022-01-12 ENCOUNTER — Other Ambulatory Visit: Payer: Self-pay

## 2022-01-12 DIAGNOSIS — Z23 Encounter for immunization: Secondary | ICD-10-CM

## 2022-01-12 NOTE — Progress Notes (Signed)
Pt was given 2nd shingles vacc w/o any complications. 

## 2022-09-13 ENCOUNTER — Encounter: Payer: Self-pay | Admitting: Internal Medicine

## 2022-09-13 NOTE — Patient Instructions (Addendum)
Flu immunization administered today.     Blood work was ordered.     Medications changes include :   None   Your prescription(s) have been sent to your pharmacy.     Return in about 1 year (around 09/15/2023) for Physical Exam.   Health Maintenance, Male Adopting a healthy lifestyle and getting preventive care are important in promoting health and wellness. Ask your health care provider about: The right schedule for you to have regular tests and exams. Things you can do on your own to prevent diseases and keep yourself healthy. What should I know about diet, weight, and exercise? Eat a healthy diet  Eat a diet that includes plenty of vegetables, fruits, low-fat dairy products, and lean protein. Do not eat a lot of foods that are high in solid fats, added sugars, or sodium. Maintain a healthy weight Body mass index (BMI) is used to identify weight problems. It estimates body fat based on height and weight. Your health care provider can help determine your BMI and help you achieve or maintain a healthy weight. Get regular exercise Get regular exercise. This is one of the most important things you can do for your health. Most adults should: Exercise for at least 150 minutes each week. The exercise should increase your heart rate and make you sweat (moderate-intensity exercise). Do strengthening exercises at least twice a week. This is in addition to the moderate-intensity exercise. Spend less time sitting. Even light physical activity can be beneficial. Watch cholesterol and blood lipids Have your blood tested for lipids and cholesterol at 56 years of age, then have this test every 5 years. Have your cholesterol levels checked more often if: Your lipid or cholesterol levels are high. You are older than 56 years of age. You are at high risk for heart disease. What should I know about cancer screening? Depending on your health history and family history, you may need to  have cancer screening at various ages. This may include screening for: Breast cancer. Cervical cancer. Colorectal cancer. Skin cancer. Lung cancer. What should I know about heart disease, diabetes, and high blood pressure? Blood pressure and heart disease High blood pressure causes heart disease and increases the risk of stroke. This is more likely to develop in people who have high blood pressure readings or are overweight. Have your blood pressure checked: Every 3-5 years if you are 5-59 years of age. Every year if you are 50 years old or older. Diabetes Have regular diabetes screenings. This checks your fasting blood sugar level. Have the screening done: Once every three years after age 69 if you are at a normal weight and have a low risk for diabetes. More often and at a younger age if you are overweight or have a high risk for diabetes. What should I know about preventing infection? Hepatitis B If you have a higher risk for hepatitis B, you should be screened for this virus. Talk with your health care provider to find out if you are at risk for hepatitis B infection. Hepatitis C Testing is recommended for: Everyone born from [birth year] through 1965. Anyone with known risk factors for hepatitis C. Sexually transmitted infections (STIs) Get screened for STIs, including gonorrhea and chlamydia, if: You are sexually active and are younger than 56 years of age. You are older than 56 years of age and your health care provider tells you that you are at risk for this type of infection. Your sexual activity has changed  since you were last screened, and you are at increased risk for chlamydia or gonorrhea. Ask your health care provider if you are at risk. Ask your health care provider about whether you are at high risk for HIV. Your health care provider may recommend a prescription medicine to help prevent HIV infection. If you choose to take medicine to prevent HIV, you should first get tested for  HIV. You should then be tested every 3 months for as long as you are taking the medicine. Pregnancy If you are about to stop having your period (premenopausal) and you may become pregnant, seek counseling before you get pregnant. Take 400 to 800 micrograms (mcg) of folic acid every day if you become pregnant. Ask for birth control (contraception) if you want to prevent pregnancy. Osteoporosis and menopause Osteoporosis is a disease in which the bones lose minerals and strength with aging. This can result in bone fractures. If you are 42 years old or older, or if you are at risk for osteoporosis and fractures, ask your health care provider if you should: Be screened for bone loss. Take a calcium or vitamin D supplement to lower your risk of fractures. Be given hormone replacement therapy (HRT) to treat symptoms of menopause. Follow these instructions at home: Alcohol use Do not drink alcohol if: Your health care provider tells you not to drink. You are pregnant, may be pregnant, or are planning to become pregnant. If you drink alcohol: Limit how much you have to: 0-1 drink a day. Know how much alcohol is in your drink. In the [place] one drink equals one 12 oz bottle of beer (355 mL), one 5 oz glass of wine (148 mL), or one 1 oz glass of hard liquor (44 mL). Lifestyle Do not use any products that contain nicotine or tobacco. These products include cigarettes, chewing tobacco, and vaping devices, such as e-cigarettes. If you need help quitting, ask your health care provider. Do not use street drugs. Do not share needles. Ask your health care provider for help if you need support or information about quitting drugs. General instructions Schedule regular health, dental, and eye exams. Stay current with your vaccines. Tell your health care provider if: You often feel depressed. You have ever been abused or do not feel safe at home. Summary Adopting a healthy lifestyle and getting preventive  care are important in promoting health and wellness. Follow your health care provider's instructions about healthy diet, exercising, and getting tested or screened for diseases. Follow your health care provider's instructions on monitoring your cholesterol and blood pressure. This information is not intended to replace advice given to you by your health care provider. Make sure you discuss any questions you have with your health care provider. Document Revised: 04/18/2021 Document Reviewed: 04/18/2021 Elsevier Patient Education  [employer].

## 2022-09-13 NOTE — Progress Notes (Unsigned)
Subjective:    Patient ID: Ryan Reyes, male    DOB: 1966-01-14, 56 y.o.   MRN: [id number]      HPI Ryan Reyes is here for a Physical exam.   He has been doing low carb diet for the year.  Has lost some weight.   Medications and allergies reviewed with patient and updated if appropriate.  Current Outpatient Medications on File Prior to Visit  Medication Sig Dispense Refill   Apple Cider Vinegar 188 MG CAPS Take 376 mg by mouth daily.     cetirizine (ZYRTEC) 10 MG tablet Take 10 mg by mouth daily.     esomeprazole (NEXIUM) 20 MG capsule Take 20 mg by mouth as needed.      Multiple Vitamin (MULTIVITAMIN WITH MINERALS) TABS tablet Take 1 tablet by mouth daily.     No current facility-administered medications on file prior to visit.    Review of Systems  Constitutional:  Negative for chills and fever.  Eyes:  Negative for visual disturbance.  Respiratory:  Negative for cough, shortness of breath and wheezing.   Cardiovascular:  Negative for chest pain, palpitations and leg swelling.  Gastrointestinal:  Negative for abdominal pain, blood in stool, constipation, diarrhea and nausea.       Gerd occ  Genitourinary:  Negative for difficulty urinating, dysuria and hematuria.  Musculoskeletal:  Positive for arthralgias (tennis elbow - left) and back pain (lower back at times).  Skin:  Negative for rash.  Neurological:  Negative for dizziness, light-headedness, numbness and headaches.  Psychiatric/Behavioral:  Negative for dysphoric mood. The patient is not nervous/anxious.        Objective:   Vitals:   09/14/22 0815  BP: 128/80  Pulse: 72  Temp: 98 F (36.7 C)  SpO2: 98%   Filed Weights   09/14/22 0815  Weight: 208 lb (94.3 kg)   Body mass index is 32.58 kg/m.  BP Readings from Last 3 Encounters:  09/14/22 128/80  09/13/21 127/74  09/09/20 120/82    Wt Readings from Last 3 Encounters:  09/14/22 208 lb (94.3 kg)  09/13/21 217 lb (98.4 kg)  09/09/20 216 lb 6.4  oz (98.2 kg)       Physical Exam Constitutional: He  appears well-developed and well-nourished. No distress.  HENT:  Head: Normocephalic and atraumatic.  Right Ear: External ear normal. Normal ear canal and TM Left Ear: External ear normal.  Normal ear canal and TM Mouth/Throat: Oropharynx is clear and moist.  Eyes: Conjunctivae normal.  Neck: Neck supple. No tracheal deviation present. No thyromegaly present.  No carotid bruit  Cardiovascular: Normal rate, regular rhythm and normal heart sounds.   No murmur heard.  No edema. Pulmonary/Chest: Effort normal and breath sounds normal. No respiratory distress. She has no wheezes. She has no rales.  Breast: deferred   Abdominal: Soft. She exhibits no distension. There is no tenderness.  Lymphadenopathy: She has no cervical adenopathy.  Skin: Skin is warm and dry. She is not diaphoretic.  Psychiatric: She has a normal mood and affect. Her behavior is normal.     Lab Results  Component Value Date   WBC 5.2 09/13/2021   HGB 14.8 09/13/2021   HCT 44.0 09/13/2021   PLT 238.0 09/13/2021   GLUCOSE 93 09/13/2021   CHOL 164 09/13/2021   TRIG 156.0 (H) 09/13/2021   HDL 49.40 09/13/2021   LDLDIRECT 160.8 08/18/2013   LDLCALC 83 09/13/2021   ALT 32 09/13/2021   AST 26 09/13/2021  NA 139 09/13/2021   K 4.1 09/13/2021   CL 102 09/13/2021   CREATININE 0.89 09/13/2021   BUN 14 09/13/2021   CO2 28 09/13/2021   TSH 2.63 09/13/2021   HGBA1C 5.5 09/13/2021         Assessment & Plan:   Physical exam: Screening blood work  ordered Exercise  walking, golf Weight  working on weight loss Substance abuse  none   Reviewed recommended immunizations.  Flu immunization administered today.     Health Maintenance  Topic Date Due   COVID-19 Vaccine (4 - Pfizer series) 09/30/2022 (Originally 12/07/2020)   COLONOSCOPY (Pts 45-43yr Insurance coverage will need to be confirmed)  02/01/2027   TETANUS/TDAP  03/22/2031   INFLUENZA VACCINE   Completed   Hepatitis C Screening  Completed   HIV Screening  Completed   Zoster Vaccines- Shingrix  Completed   HPV VACCINES  Aged Out          See Problem List for Assessment and Plan of chronic medical problems.

## 2022-09-14 ENCOUNTER — Ambulatory Visit (INDEPENDENT_AMBULATORY_CARE_PROVIDER_SITE_OTHER): Payer: 59 | Admitting: Internal Medicine

## 2022-09-14 VITALS — BP 128/80 | HR 72 | Temp 98.0°F | Ht 67.0 in | Wt 208.0 lb

## 2022-09-14 DIAGNOSIS — R739 Hyperglycemia, unspecified: Secondary | ICD-10-CM

## 2022-09-14 DIAGNOSIS — Z23 Encounter for immunization: Secondary | ICD-10-CM | POA: Diagnosis not present

## 2022-09-14 DIAGNOSIS — Z0000 Encounter for general adult medical examination without abnormal findings: Secondary | ICD-10-CM | POA: Diagnosis not present

## 2022-09-14 DIAGNOSIS — E782 Mixed hyperlipidemia: Secondary | ICD-10-CM

## 2022-09-14 DIAGNOSIS — K219 Gastro-esophageal reflux disease without esophagitis: Secondary | ICD-10-CM

## 2022-09-14 DIAGNOSIS — Z125 Encounter for screening for malignant neoplasm of prostate: Secondary | ICD-10-CM

## 2022-09-14 DIAGNOSIS — E785 Hyperlipidemia, unspecified: Secondary | ICD-10-CM

## 2022-09-14 LAB — CBC WITH DIFFERENTIAL/PLATELET
Basophils Absolute: 0 10*3/uL (ref 0.0–0.1)
Basophils Relative: 0.5 % (ref 0.0–3.0)
Eosinophils Absolute: 0.1 10*3/uL (ref 0.0–0.7)
Eosinophils Relative: 2.8 % (ref 0.0–5.0)
HCT: 42.2 % (ref 39.0–52.0)
Hemoglobin: 14.5 g/dL (ref 13.0–17.0)
Lymphocytes Relative: 30.6 % (ref 12.0–46.0)
Lymphs Abs: 1.6 10*3/uL (ref 0.7–4.0)
MCHC: 34.3 g/dL (ref 30.0–36.0)
MCV: 92.8 fl (ref 78.0–100.0)
Monocytes Absolute: 0.5 10*3/uL (ref 0.1–1.0)
Monocytes Relative: 10.1 % (ref 3.0–12.0)
Neutro Abs: 3 10*3/uL (ref 1.4–7.7)
Neutrophils Relative %: 56 % (ref 43.0–77.0)
Platelets: 232 10*3/uL (ref 150.0–400.0)
RBC: 4.54 Mil/uL (ref 4.22–5.81)
RDW: 13.3 % (ref 11.5–15.5)
WBC: 5.3 10*3/uL (ref 4.0–10.5)

## 2022-09-14 LAB — LIPID PANEL
Cholesterol: 157 mg/dL (ref 0–200)
HDL: 53.5 mg/dL (ref >39.00)
LDL Cholesterol: 81 mg/dL (ref 0–99)
NonHDL: 103.57
Total CHOL/HDL Ratio: 3
Triglycerides: 113 mg/dL (ref 0.0–149.0)
VLDL: 22.6 mg/dL (ref 0.0–40.0)

## 2022-09-14 LAB — COMPREHENSIVE METABOLIC PANEL
ALT: 22 U/L (ref 0–53)
AST: 20 U/L (ref 0–37)
Albumin: 4.5 g/dL (ref 3.5–5.2)
Alkaline Phosphatase: 63 U/L (ref 39–117)
BUN: 14 mg/dL (ref 6–23)
CO2: 30 mEq/L (ref 19–32)
Calcium: 9.3 mg/dL (ref 8.4–10.5)
Chloride: 103 mEq/L (ref 96–112)
Creatinine, Ser: 0.86 mg/dL (ref 0.40–1.50)
GFR: 97.1 mL/min (ref >60.00)
Glucose, Bld: 96 mg/dL (ref 70–99)
Potassium: 4.1 mEq/L (ref 3.5–5.1)
Sodium: 138 mEq/L (ref 135–145)
Total Bilirubin: 0.7 mg/dL (ref 0.2–1.2)
Total Protein: 6.9 g/dL (ref 6.0–8.3)

## 2022-09-14 LAB — HEMOGLOBIN A1C: Hgb A1c MFr Bld: 5.5 % (ref 4.6–6.5)

## 2022-09-14 LAB — TSH: TSH: 1.8 u[IU]/mL (ref 0.35–5.50)

## 2022-09-14 LAB — PSA: PSA: 1.42 ng/mL (ref 0.10–4.00)

## 2022-09-14 MED ORDER — SIMVASTATIN 20 MG PO TABS: ORAL_TABLET | ORAL | 3 refills | Status: DC

## 2022-09-14 NOTE — Assessment & Plan Note (Addendum)
Chronic Regular exercise and healthy diet encouraged Check lipid panel, CMP, CBC, TSH Continue simvastatin 20 mg daily -- may try TIW given coronary calcium score and low risk --- will be getting repeat labs at [facility] in 6 months

## 2022-09-14 NOTE — Assessment & Plan Note (Signed)
Chronic Check a1c Low sugar / carb diet Stressed regular exercise  

## 2022-09-14 NOTE — Assessment & Plan Note (Signed)
Chronic GERD controlled Continue Nexium 20 mg daily as needed

## 2023-09-17 DIAGNOSIS — E559 Vitamin D deficiency, unspecified: Secondary | ICD-10-CM | POA: Insufficient documentation

## 2023-09-17 NOTE — Patient Instructions (Addendum)
Flu immunization administered today.     Blood work was ordered.   The lab is on the first floor.    Medications changes include :   None    A Ct for your coronary arteries was ordered and someone will call you to schedule an appointment.     Return in about 1 year (around 09/17/2024) for Physical Exam.   Health Maintenance, Male Adopting a healthy lifestyle and getting preventive care are important in promoting health and wellness. Ask your health care provider about: The right schedule for you to have regular tests and exams. Things you can do on your own to prevent diseases and keep yourself healthy. What should I know about diet, weight, and exercise? Eat a healthy diet  Eat a diet that includes plenty of vegetables, fruits, low-fat dairy products, and lean protein. Do not eat a lot of foods that are high in solid fats, added sugars, or sodium. Maintain a healthy weight Body mass index (BMI) is a measurement that can be used to identify possible weight problems. It estimates body fat based on height and weight. Your health care provider can help determine your BMI and help you achieve or maintain a healthy weight. Get regular exercise Get regular exercise. This is one of the most important things you can do for your health. Most adults should: Exercise for at least 150 minutes each week. The exercise should increase your heart rate and make you sweat (moderate-intensity exercise). Do strengthening exercises at least twice a week. This is in addition to the moderate-intensity exercise. Spend less time sitting. Even light physical activity can be beneficial. Watch cholesterol and blood lipids Have your blood tested for lipids and cholesterol at 57 years of age, then have this test every 5 years. You may need to have your cholesterol levels checked more often if: Your lipid or cholesterol levels are high. You are older than 57 years of age. You are at high risk for  heart disease. What should I know about cancer screening? Many types of cancers can be detected early and may often be prevented. Depending on your health history and family history, you may need to have cancer screening at various ages. This may include screening for: Colorectal cancer. Prostate cancer. Skin cancer. Lung cancer. What should I know about heart disease, diabetes, and high blood pressure? Blood pressure and heart disease High blood pressure causes heart disease and increases the risk of stroke. This is more likely to develop in people who have high blood pressure readings or are overweight. Talk with your health care provider about your target blood pressure readings. Have your blood pressure checked: Every 3-5 years if you are 3-62 years of age. Every year if you are 60 years old or older. If you are between the ages of 52 and 90 and are a current or former smoker, ask your health care provider if you should have a one-time screening for abdominal aortic aneurysm (AAA). Diabetes Have regular diabetes screenings. This checks your fasting blood sugar level. Have the screening done: Once every three years after age 44 if you are at a normal weight and have a low risk for diabetes. More often and at a younger age if you are overweight or have a high risk for diabetes. What should I know about preventing infection? Hepatitis B If you have a higher risk for hepatitis B, you should be screened for this virus. Talk with your health care provider to find  out if you are at risk for hepatitis B infection. Hepatitis C Blood testing is recommended for: Everyone born from [birth year] through 1965. Anyone with known risk factors for hepatitis C. Sexually transmitted infections (STIs) You should be screened each year for STIs, including gonorrhea and chlamydia, if: You are sexually active and are younger than 57 years of age. You are older than 57 years of age and your health care provider  tells you that you are at risk for this type of infection. Your sexual activity has changed since you were last screened, and you are at increased risk for chlamydia or gonorrhea. Ask your health care provider if you are at risk. Ask your health care provider about whether you are at high risk for HIV. Your health care provider may recommend a prescription medicine to help prevent HIV infection. If you choose to take medicine to prevent HIV, you should first get tested for HIV. You should then be tested every 3 months for as long as you are taking the medicine. Follow these instructions at home: Alcohol use Do not drink alcohol if your health care provider tells you not to drink. If you drink alcohol: Limit how much you have to 0-2 drinks a day. Know how much alcohol is in your drink. In the [place] one drink equals one 12 oz bottle of beer (355 mL), one 5 oz glass of wine (148 mL), or one 1 oz glass of hard liquor (44 mL). Lifestyle Do not use any products that contain nicotine or tobacco. These products include cigarettes, chewing tobacco, and vaping devices, such as e-cigarettes. If you need help quitting, ask your health care provider. Do not use street drugs. Do not share needles. Ask your health care provider for help if you need support or information about quitting drugs. General instructions Schedule regular health, dental, and eye exams. Stay current with your vaccines. Tell your health care provider if: You often feel depressed. You have ever been abused or do not feel safe at home. Summary Adopting a healthy lifestyle and getting preventive care are important in promoting health and wellness. Follow your health care provider's instructions about healthy diet, exercising, and getting tested or screened for diseases. Follow your health care provider's instructions on monitoring your cholesterol and blood pressure. This information is not intended to replace advice given to you by  your health care provider. Make sure you discuss any questions you have with your health care provider. Document Revised: 04/18/2021 Document Reviewed: 04/18/2021 Elsevier Patient Education  2024 [employer].

## 2023-09-17 NOTE — Progress Notes (Unsigned)
Subjective:    Patient ID: Ryan Reyes, male    DOB: Dec 11, 1966, 57 y.o.   MRN: [id number]     HPI Ryan Reyes is here for a physical exam and his chronic medical problems.   Overall doing well.  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  Snoring - not nightly per his phone.  His wife was concerned - he sometimes snores first thing at night.  Does not typically sleep on his back.  Last night he had no reported snoring per his watch.    Wants to have CAC score - had one 5 years ago.     Medications and allergies reviewed with patient and updated if appropriate.  Current Outpatient Medications on File Prior to Visit  Medication Sig Dispense Refill   Apple Cider Vinegar 188 MG CAPS Take 376 mg by mouth daily.     cetirizine (ZYRTEC) 10 MG tablet Take 10 mg by mouth daily.     esomeprazole (NEXIUM) 20 MG capsule Take 20 mg by mouth as needed.      Multiple Vitamin (MULTIVITAMIN WITH MINERALS) TABS tablet Take 1 tablet by mouth daily.     simvastatin (ZOCOR) 20 MG tablet TAKE 1 TABLET(20 MG) BY MOUTH AT BEDTIME 90 tablet 3   No current facility-administered medications on file prior to visit.    Review of Systems  Constitutional:  Negative for fever.  Eyes:  Negative for visual disturbance.  Respiratory:  Negative for cough, shortness of breath and wheezing.   Cardiovascular:  Negative for chest pain, palpitations and leg swelling.  Gastrointestinal:  Negative for abdominal pain, blood in stool, constipation and diarrhea.       No gerd  Genitourinary:  Negative for difficulty urinating, dysuria and hematuria.  Musculoskeletal:  Positive for back pain (occ lower back). Negative for arthralgias.  Skin:  Negative for rash.  Neurological:  Negative for light-headedness and headaches.  Psychiatric/Behavioral:  Negative for dysphoric mood. The patient is not nervous/anxious.        Objective:   Vitals:   09/18/23 0804  BP: 118/74  Pulse: 80  Temp: 98.6 F (37 C)   SpO2: 97%   Filed Weights   09/18/23 0804  Weight: 217 lb (98.4 kg)   Body mass index is 33.99 kg/m.  BP Readings from Last 3 Encounters:  09/18/23 118/74  09/14/22 128/80  09/13/21 127/74    Wt Readings from Last 3 Encounters:  09/18/23 217 lb (98.4 kg)  09/14/22 208 lb (94.3 kg)  09/13/21 217 lb (98.4 kg)      Physical Exam Constitutional: He appears well-developed and well-nourished. No distress.  HENT:  Head: Normocephalic and atraumatic.  Right Ear: External ear normal.  Left Ear: External ear normal.  Normal ear canals and TM b/l  Mouth/Throat: Oropharynx is clear and moist. Eyes: Conjunctivae and EOM are normal.  Neck: Neck supple. No tracheal deviation present. No thyromegaly present.  No carotid bruit  Cardiovascular: Normal rate, regular rhythm, normal heart sounds and intact distal pulses.   No murmur heard.  No lower extremity edema. Pulmonary/Chest: Effort normal and breath sounds normal. No respiratory distress. He has no wheezes. He has no rales.  Abdominal: Soft. He exhibits no distension. There is no tenderness.  Genitourinary: deferred  Lymphadenopathy:   He has no cervical adenopathy.  Skin: Skin is warm and dry. He is not diaphoretic.  Psychiatric: He has a normal mood and affect. His behavior is normal.  Assessment & Plan:   Physical exam: Screening blood work  ordered Exercise   not regular but does golf and walks some - will restart after moving into new house Weight  has gained weight -- will work on weight loss Substance abuse   none   Reviewed recommended immunizations.  Flu immunization administered today.     Health Maintenance  Topic Date Due   INFLUENZA VACCINE  07/12/2023   COVID-19 Vaccine (4 - 2023-24 season) 10/04/2023 (Originally 08/12/2023)   Colonoscopy  02/01/2027   DTaP/Tdap/Td (4 - Td or Tdap) 03/22/2031   Hepatitis C Screening  Completed   HIV Screening  Completed   Zoster Vaccines- Shingrix  Completed    HPV VACCINES  Aged Out     See Problem List for Assessment and Plan of chronic medical problems.

## 2023-09-18 ENCOUNTER — Encounter: Payer: Self-pay | Admitting: Internal Medicine

## 2023-09-18 ENCOUNTER — Ambulatory Visit (INDEPENDENT_AMBULATORY_CARE_PROVIDER_SITE_OTHER): Payer: 59 | Admitting: Internal Medicine

## 2023-09-18 VITALS — BP 118/74 | HR 80 | Temp 98.6°F | Ht 67.0 in | Wt 217.0 lb

## 2023-09-18 DIAGNOSIS — Z0000 Encounter for general adult medical examination without abnormal findings: Secondary | ICD-10-CM

## 2023-09-18 DIAGNOSIS — Z125 Encounter for screening for malignant neoplasm of prostate: Secondary | ICD-10-CM | POA: Diagnosis not present

## 2023-09-18 DIAGNOSIS — E782 Mixed hyperlipidemia: Secondary | ICD-10-CM | POA: Diagnosis not present

## 2023-09-18 DIAGNOSIS — K219 Gastro-esophageal reflux disease without esophagitis: Secondary | ICD-10-CM

## 2023-09-18 DIAGNOSIS — E559 Vitamin D deficiency, unspecified: Secondary | ICD-10-CM | POA: Diagnosis not present

## 2023-09-18 DIAGNOSIS — R739 Hyperglycemia, unspecified: Secondary | ICD-10-CM | POA: Diagnosis not present

## 2023-09-18 DIAGNOSIS — E785 Hyperlipidemia, unspecified: Secondary | ICD-10-CM

## 2023-09-18 DIAGNOSIS — Z23 Encounter for immunization: Secondary | ICD-10-CM | POA: Diagnosis not present

## 2023-09-18 DIAGNOSIS — Z136 Encounter for screening for cardiovascular disorders: Secondary | ICD-10-CM

## 2023-09-18 LAB — CBC WITH DIFFERENTIAL/PLATELET
Basophils Absolute: 0 10*3/uL (ref 0.0–0.1)
Basophils Relative: 0.5 % (ref 0.0–3.0)
Eosinophils Absolute: 0.2 10*3/uL (ref 0.0–0.7)
Eosinophils Relative: 2.8 % (ref 0.0–5.0)
HCT: 45.8 % (ref 39.0–52.0)
Hemoglobin: 15.3 g/dL (ref 13.0–17.0)
Lymphocytes Relative: 29.1 % (ref 12.0–46.0)
Lymphs Abs: 1.7 10*3/uL (ref 0.7–4.0)
MCHC: 33.5 g/dL (ref 30.0–36.0)
MCV: 93.4 fL (ref 78.0–100.0)
Monocytes Absolute: 0.6 10*3/uL (ref 0.1–1.0)
Monocytes Relative: 11 % (ref 3.0–12.0)
Neutro Abs: 3.2 10*3/uL (ref 1.4–7.7)
Neutrophils Relative %: 56.6 % (ref 43.0–77.0)
Platelets: 261 10*3/uL (ref 150.0–400.0)
RBC: 4.9 Mil/uL (ref 4.22–5.81)
RDW: 13.4 % (ref 11.5–15.5)
WBC: 5.7 10*3/uL (ref 4.0–10.5)

## 2023-09-18 LAB — HEMOGLOBIN A1C: Hgb A1c MFr Bld: 5.5 % (ref 4.6–6.5)

## 2023-09-18 LAB — COMPREHENSIVE METABOLIC PANEL
ALT: 33 U/L (ref 0–53)
AST: 26 U/L (ref 0–37)
Albumin: 4.5 g/dL (ref 3.5–5.2)
Alkaline Phosphatase: 66 U/L (ref 39–117)
BUN: 15 mg/dL (ref 6–23)
CO2: 26 meq/L (ref 19–32)
Calcium: 9.8 mg/dL (ref 8.4–10.5)
Chloride: 103 meq/L (ref 96–112)
Creatinine, Ser: 0.92 mg/dL (ref 0.40–1.50)
GFR: 92.63 mL/min (ref >60.00)
Glucose, Bld: 101 mg/dL — ABNORMAL HIGH (ref 70–99)
Potassium: 4.3 meq/L (ref 3.5–5.1)
Sodium: 139 meq/L (ref 135–145)
Total Bilirubin: 0.8 mg/dL (ref 0.2–1.2)
Total Protein: 7 g/dL (ref 6.0–8.3)

## 2023-09-18 LAB — LIPID PANEL
Cholesterol: 183 mg/dL (ref 0–200)
HDL: 51.9 mg/dL (ref >39.00)
LDL Cholesterol: 101 mg/dL — ABNORMAL HIGH (ref 0–99)
NonHDL: 131.49
Total CHOL/HDL Ratio: 4
Triglycerides: 152 mg/dL — ABNORMAL HIGH (ref 0.0–149.0)
VLDL: 30.4 mg/dL (ref 0.0–40.0)

## 2023-09-18 LAB — VITAMIN D 25 HYDROXY (VIT D DEFICIENCY, FRACTURES): VITD: 23.72 ng/mL — ABNORMAL LOW (ref 30.00–100.00)

## 2023-09-18 LAB — PSA: PSA: 2.2 ng/mL (ref 0.10–4.00)

## 2023-09-18 LAB — TSH: TSH: 1.78 u[IU]/mL (ref 0.35–5.50)

## 2023-09-18 MED ORDER — SIMVASTATIN 20 MG PO TABS
ORAL_TABLET | ORAL | 3 refills | Status: DC
Start: 2023-09-18 — End: 2023-11-06

## 2023-09-18 NOTE — Assessment & Plan Note (Signed)
Chronic GERD controlled Continue Nexium 20 mg daily as needed

## 2023-09-18 NOTE — Assessment & Plan Note (Addendum)
Chronic H/o CAC score of 13 in 2019 -- he would like to recheck this which I think is reasonable - Ct CAC score ordered Regular exercise and healthy diet encouraged Check lipid panel, CMP, CBC, TSH Continue simvastatin 20 mg daily

## 2023-09-18 NOTE — Assessment & Plan Note (Signed)
Chronic Taking vitamin D daily Check vitamin D level  

## 2023-09-18 NOTE — Assessment & Plan Note (Signed)
Chronic Lab Results  Component Value Date   HGBA1C 5.5 09/14/2022   Check a1c Low sugar / carb diet Stressed regular exercise

## 2023-10-22 ENCOUNTER — Ambulatory Visit (HOSPITAL_BASED_OUTPATIENT_CLINIC_OR_DEPARTMENT_OTHER): Payer: 59

## 2023-10-29 ENCOUNTER — Ambulatory Visit (HOSPITAL_BASED_OUTPATIENT_CLINIC_OR_DEPARTMENT_OTHER)
Admission: RE | Admit: 2023-10-29 | Discharge: 2023-10-29 | Disposition: A | Discharge status: Home / Self Care | Payer: 59 | Source: Ambulatory Visit | Attending: Internal Medicine | Admitting: Internal Medicine

## 2023-10-29 DIAGNOSIS — Z136 Encounter for screening for cardiovascular disorders: Secondary | ICD-10-CM | POA: Insufficient documentation

## 2023-10-31 ENCOUNTER — Encounter: Payer: Self-pay | Admitting: Internal Medicine

## 2023-10-31 DIAGNOSIS — R931 Abnormal findings on diagnostic imaging of heart and coronary circulation: Secondary | ICD-10-CM | POA: Insufficient documentation

## 2023-11-05 ENCOUNTER — Encounter: Payer: Self-pay | Admitting: Internal Medicine

## 2023-11-05 DIAGNOSIS — E7800 Pure hypercholesterolemia, unspecified: Secondary | ICD-10-CM

## 2023-11-05 DIAGNOSIS — R931 Abnormal findings on diagnostic imaging of heart and coronary circulation: Secondary | ICD-10-CM

## 2023-11-06 MED ORDER — ROSUVASTATIN CALCIUM 20 MG PO TABS: 20.0000 mg | ORAL_TABLET | Freq: Every day | ORAL | 3 refills | Status: DC

## 2024-01-07 ENCOUNTER — Encounter: Payer: Self-pay | Admitting: Internal Medicine

## 2024-01-07 ENCOUNTER — Other Ambulatory Visit (INDEPENDENT_AMBULATORY_CARE_PROVIDER_SITE_OTHER): Payer: 59

## 2024-01-07 DIAGNOSIS — E7800 Pure hypercholesterolemia, unspecified: Secondary | ICD-10-CM

## 2024-01-07 LAB — LIPID PANEL
Cholesterol: 142 mg/dL (ref 0–200)
HDL: 50.2 mg/dL (ref >39.00)
LDL Cholesterol: 64 mg/dL (ref 0–99)
NonHDL: 92.14
Total CHOL/HDL Ratio: 3
Triglycerides: 139 mg/dL (ref 0.0–149.0)
VLDL: 27.8 mg/dL (ref 0.0–40.0)

## 2024-01-07 LAB — HEPATIC FUNCTION PANEL
ALT: 29 U/L (ref 0–53)
AST: 26 U/L (ref 0–37)
Albumin: 4.5 g/dL (ref 3.5–5.2)
Alkaline Phosphatase: 63 U/L (ref 39–117)
Bilirubin, Direct: 0.1 mg/dL (ref 0.0–0.3)
Total Bilirubin: 0.7 mg/dL (ref 0.2–1.2)
Total Protein: 7.1 g/dL (ref 6.0–8.3)

## 2024-09-16 ENCOUNTER — Encounter: Payer: Self-pay | Admitting: Internal Medicine

## 2024-09-16 DIAGNOSIS — I2510 Atherosclerotic heart disease of native coronary artery without angina pectoris: Secondary | ICD-10-CM | POA: Insufficient documentation

## 2024-09-16 NOTE — Patient Instructions (Addendum)
 Blood work was ordered.       Medications changes include :   None    A referral was ordered and someone will call you to schedule an appointment.     Return in about 1 year (around 09/17/2025) for Physical Exam.    Health Maintenance, Male Adopting a healthy lifestyle and getting preventive care are important in promoting health and wellness. Ask your health care provider about: The right schedule for you to have regular tests and exams. Things you can do on your own to prevent diseases and keep yourself healthy. What should I know about diet, weight, and exercise? Eat a healthy diet  Eat a diet that includes plenty of vegetables, fruits, low-fat dairy products, and lean protein. Do not eat a lot of foods that are high in solid fats, added sugars, or sodium. Maintain a healthy weight Body mass index (BMI) is a measurement that can be used to identify possible weight problems. It estimates body fat based on height and weight. Your health care provider can help determine your BMI and help you achieve or maintain a healthy weight. Get regular exercise Get regular exercise. This is one of the most important things you can do for your health. Most adults should: Exercise for at least 150 minutes each week. The exercise should increase your heart rate and make you sweat (moderate-intensity exercise). Do strengthening exercises at least twice a week. This is in addition to the moderate-intensity exercise. Spend less time sitting. Even light physical activity can be beneficial. Watch cholesterol and blood lipids Have your blood tested for lipids and cholesterol at 58 years of age, then have this test every 5 years. You may need to have your cholesterol levels checked more often if: Your lipid or cholesterol levels are high. You are older than 58 years of age. You are at high risk for heart disease. What should I know about cancer screening? Many types of cancers can be  detected early and may often be prevented. Depending on your health history and family history, you may need to have cancer screening at various ages. This may include screening for: Colorectal cancer. Prostate cancer. Skin cancer. Lung cancer. What should I know about heart disease, diabetes, and high blood pressure? Blood pressure and heart disease High blood pressure causes heart disease and increases the risk of stroke. This is more likely to develop in people who have high blood pressure readings or are overweight. Talk with your health care provider about your target blood pressure readings. Have your blood pressure checked: Every 3-5 years if you are 51-66 years of age. Every year if you are 37 years old or older. If you are between the ages of 78 and 17 and are a current or former smoker, ask your health care provider if you should have a one-time screening for abdominal aortic aneurysm (AAA). Diabetes Have regular diabetes screenings. This checks your fasting blood sugar level. Have the screening done: Once every three years after age 70 if you are at a normal weight and have a low risk for diabetes. More often and at a younger age if you are overweight or have a high risk for diabetes. What should I know about preventing infection? Hepatitis B If you have a higher risk for hepatitis B, you should be screened for this virus. Talk with your health care provider to find out if you are at risk for hepatitis B infection. Hepatitis C Blood testing is recommended  for: Everyone born from [birth year] through 1965. Anyone with known risk factors for hepatitis C. Sexually transmitted infections (STIs) You should be screened each year for STIs, including gonorrhea and chlamydia, if: You are sexually active and are younger than 58 years of age. You are older than 58 years of age and your health care provider tells you that you are at risk for this type of infection. Your sexual activity has changed  since you were last screened, and you are at increased risk for chlamydia or gonorrhea. Ask your health care provider if you are at risk. Ask your health care provider about whether you are at high risk for HIV. Your health care provider may recommend a prescription medicine to help prevent HIV infection. If you choose to take medicine to prevent HIV, you should first get tested for HIV. You should then be tested every 3 months for as long as you are taking the medicine. Follow these instructions at home: Alcohol use Do not drink alcohol if your health care provider tells you not to drink. If you drink alcohol: Limit how much you have to 0-2 drinks a day. Know how much alcohol is in your drink. In the [place] one drink equals one 12 oz bottle of beer (355 mL), one 5 oz glass of wine (148 mL), or one 1 oz glass of hard liquor (44 mL). Lifestyle Do not use any products that contain nicotine or tobacco. These products include cigarettes, chewing tobacco, and vaping devices, such as e-cigarettes. If you need help quitting, ask your health care provider. Do not use street drugs. Do not share needles. Ask your health care provider for help if you need support or information about quitting drugs. General instructions Schedule regular health, dental, and eye exams. Stay current with your vaccines. Tell your health care provider if: You often feel depressed. You have ever been abused or do not feel safe at home. Summary Adopting a healthy lifestyle and getting preventive care are important in promoting health and wellness. Follow your health care provider's instructions about healthy diet, exercising, and getting tested or screened for diseases. Follow your health care provider's instructions on monitoring your cholesterol and blood pressure. This information is not intended to replace advice given to you by your health care provider. Make sure you discuss any questions you have with your health care  provider. Document Revised: 04/18/2021 Document Reviewed: 04/18/2021 Elsevier Patient Education  2024 [employer].

## 2024-09-16 NOTE — Progress Notes (Unsigned)
 Subjective:    Patient ID: Ryan Reyes, male    DOB: August 17, 1966, 58 y.o.   MRN: [id number]     HPI Ryan Reyes is here for a physical exam and his chronic medical problems.   More aches and pain in left arm.   No chest pain, SOB.  Dad died at 65.  Pain in upper arm.  No pain now.  Was worried if this was heart related. Not related to activity.   Medications and allergies reviewed with patient and updated if appropriate.  Current Outpatient Medications on File Prior to Visit  Medication Sig Dispense Refill   Apple Cider Vinegar 188 MG CAPS Take 376 mg by mouth daily.     aspirin EC (ASPIRIN 81) 81 MG tablet Take 81 mg by mouth daily.     esomeprazole  (NEXIUM ) 20 MG capsule Take 20 mg by mouth as needed.      loratadine (CLARITIN) 10 MG tablet Take 10 mg by mouth daily.     Multiple Vitamin (MULTIVITAMIN WITH MINERALS) TABS tablet Take 1 tablet by mouth daily.     rosuvastatin  (CRESTOR ) 20 MG tablet Take 1 tablet (20 mg total) by mouth daily. 90 tablet 3   VITAMIN D , CHOLECALCIFEROL, PO Take 1,000 mcg by mouth daily.     No current facility-administered medications on file prior to visit.    Review of Systems  Constitutional:  Negative for fever.  Eyes:  Negative for visual disturbance.  Respiratory:  Negative for cough, shortness of breath and wheezing.   Cardiovascular:  Negative for chest pain, palpitations and leg swelling.  Gastrointestinal:  Negative for abdominal pain, blood in stool, constipation and diarrhea.       Occ gerd - takes nexium  prn  Genitourinary:  Negative for difficulty urinating, dysuria and hematuria.  Musculoskeletal:  Positive for neck pain (tension in lower neck). Negative for arthralgias and back pain.       Left upper arm pain - intermittent  Skin:  Negative for rash.  Neurological:  Negative for light-headedness and headaches.  Psychiatric/Behavioral:  Positive for sleep disturbance (often wakes up early and brain starts thinking). Negative for  dysphoric mood. The patient is not nervous/anxious.        Objective:   Vitals:   09/17/24 0811  BP: 124/78  Pulse: 63  Temp: 98.2 F (36.8 C)  SpO2: 97%   Filed Weights   09/17/24 0811  Weight: 223 lb (101.2 kg)   Body mass index is 34.93 kg/m.  BP Readings from Last 3 Encounters:  09/17/24 124/78  09/18/23 118/74  09/14/22 128/80    Wt Readings from Last 3 Encounters:  09/17/24 223 lb (101.2 kg)  09/18/23 217 lb (98.4 kg)  09/14/22 208 lb (94.3 kg)      Physical Exam Constitutional: He appears well-developed and well-nourished. No distress.  HENT:  Head: Normocephalic and atraumatic.  Right Ear: External ear normal.  Left Ear: External ear normal.  Normal ear canals and TM b/l  Mouth/Throat: Oropharynx is clear and moist. Eyes: Conjunctivae and EOM are normal.  Neck: Neck supple. No tracheal deviation present. No thyromegaly present.  No carotid bruit  Cardiovascular: Normal rate, regular rhythm, normal heart sounds and intact distal pulses.   No murmur heard.  No lower extremity edema. Pulmonary/Chest: Effort normal and breath sounds normal. No respiratory distress. He has no wheezes. He has no rales.  Abdominal: Soft. He exhibits no distension. There is no tenderness.  Genitourinary: deferred Msk: slight tenderness  in arm with certain position  Lymphadenopathy:   He has no cervical adenopathy.  Skin: Skin is warm and dry. He is not diaphoretic.  Psychiatric: He has a normal mood and affect. His behavior is normal.         Assessment & Plan:   Physical exam: Screening blood work  ordered Exercise   golf - no other exercise Weight  obese Substance abuse   none   Reviewed recommended immunizations.   Health Maintenance  Topic Date Due   Pneumococcal Vaccine: 50+ Years (1 of 2 - PCV) Never done   Hepatitis B Vaccines 19-59 Average Risk (1 of 3 - 19+ 3-dose series) Never done   Influenza Vaccine  07/11/2024   COVID-19 Vaccine (4 - 2025-26  season) 08/11/2024   Colonoscopy  02/01/2027   DTaP/Tdap/Td (4 - Td or Tdap) 03/22/2031   Hepatitis C Screening  Completed   HIV Screening  Completed   Zoster Vaccines- Shingrix  Completed   HPV VACCINES  Aged Out   Meningococcal B Vaccine  Aged Out     See Problem List for Assessment and Plan of chronic medical problems.   Flu immunization administered today.

## 2024-09-17 ENCOUNTER — Ambulatory Visit: Payer: 59 | Admitting: Internal Medicine

## 2024-09-17 ENCOUNTER — Encounter: Payer: 59 | Admitting: Internal Medicine

## 2024-09-17 VITALS — BP 124/78 | HR 63 | Temp 98.2°F | Ht 67.0 in | Wt 223.0 lb

## 2024-09-17 DIAGNOSIS — Z23 Encounter for immunization: Secondary | ICD-10-CM

## 2024-09-17 DIAGNOSIS — I2510 Atherosclerotic heart disease of native coronary artery without angina pectoris: Secondary | ICD-10-CM

## 2024-09-17 DIAGNOSIS — K219 Gastro-esophageal reflux disease without esophagitis: Secondary | ICD-10-CM

## 2024-09-17 DIAGNOSIS — Z0000 Encounter for general adult medical examination without abnormal findings: Secondary | ICD-10-CM

## 2024-09-17 DIAGNOSIS — R739 Hyperglycemia, unspecified: Secondary | ICD-10-CM | POA: Diagnosis not present

## 2024-09-17 DIAGNOSIS — M79602 Pain in left arm: Secondary | ICD-10-CM

## 2024-09-17 DIAGNOSIS — Z125 Encounter for screening for malignant neoplasm of prostate: Secondary | ICD-10-CM | POA: Diagnosis not present

## 2024-09-17 DIAGNOSIS — E782 Mixed hyperlipidemia: Secondary | ICD-10-CM

## 2024-09-17 DIAGNOSIS — E559 Vitamin D deficiency, unspecified: Secondary | ICD-10-CM | POA: Diagnosis not present

## 2024-09-17 LAB — LIPID PANEL
Cholesterol: 159 mg/dL (ref 0–200)
HDL: 48.6 mg/dL (ref >39.00)
LDL Cholesterol: 78 mg/dL (ref 0–99)
NonHDL: 110.82
Total CHOL/HDL Ratio: 3
Triglycerides: 162 mg/dL — ABNORMAL HIGH (ref 0.0–149.0)
VLDL: 32.4 mg/dL (ref 0.0–40.0)

## 2024-09-17 LAB — CBC
HCT: 44.8 % (ref 39.0–52.0)
Hemoglobin: 15.1 g/dL (ref 13.0–17.0)
MCHC: 33.7 g/dL (ref 30.0–36.0)
MCV: 92.6 fl (ref 78.0–100.0)
Platelets: 236 K/uL (ref 150.0–400.0)
RBC: 4.84 Mil/uL (ref 4.22–5.81)
RDW: 13.5 % (ref 11.5–15.5)
WBC: 5.5 K/uL (ref 4.0–10.5)

## 2024-09-17 LAB — COMPREHENSIVE METABOLIC PANEL WITH GFR
ALT: 46 U/L (ref 0–53)
AST: 33 U/L (ref 0–37)
Albumin: 4.7 g/dL (ref 3.5–5.2)
Alkaline Phosphatase: 69 U/L (ref 39–117)
BUN: 15 mg/dL (ref 6–23)
CO2: 30 meq/L (ref 19–32)
Calcium: 9.7 mg/dL (ref 8.4–10.5)
Chloride: 103 meq/L (ref 96–112)
Creatinine, Ser: 0.83 mg/dL (ref 0.40–1.50)
GFR: 96.78 mL/min (ref >60.00)
Glucose, Bld: 106 mg/dL — ABNORMAL HIGH (ref 70–99)
Potassium: 4.8 meq/L (ref 3.5–5.1)
Sodium: 140 meq/L (ref 135–145)
Total Bilirubin: 0.6 mg/dL (ref 0.2–1.2)
Total Protein: 7 g/dL (ref 6.0–8.3)

## 2024-09-17 LAB — VITAMIN D 25 HYDROXY (VIT D DEFICIENCY, FRACTURES): VITD: 24.8 ng/mL — ABNORMAL LOW (ref 30.00–100.00)

## 2024-09-17 LAB — TSH: TSH: 2.05 u[IU]/mL (ref 0.35–5.50)

## 2024-09-17 LAB — PSA, MEDICARE: PSA: 1.5 ng/mL (ref 0.10–4.00)

## 2024-09-17 LAB — HEMOGLOBIN A1C: Hgb A1c MFr Bld: 5.8 % (ref 4.6–6.5)

## 2024-09-17 NOTE — Assessment & Plan Note (Signed)
 Chronic Mild nonobstructive CT CAC 58 in 2024-progressed from 2019 when it was 13 Continue rosuvastatin  20 mg daily Continue regular exercise, heart healthy diet Check lipid panel, CMP, CBC, TSH

## 2024-09-17 NOTE — Assessment & Plan Note (Signed)
 Chronic Taking vitamin D daily Check vitamin D level

## 2024-09-17 NOTE — Assessment & Plan Note (Signed)
Chronic GERD controlled Continue Nexium 20 mg daily as needed

## 2024-09-17 NOTE — Assessment & Plan Note (Signed)
 Chronic Mild CAD-nonobstructive-CT CAC 58 in 2024 Regular exercise and healthy diet encouraged Check lipid panel, CMP, CBC, TSH Continue Crestor  20 mg daily

## 2024-09-17 NOTE — Assessment & Plan Note (Signed)
 Chronic Lab Results  Component Value Date   HGBA1C 5.5 09/18/2023   Check a1c Low sugar / carb diet Stressed regular exercise

## 2024-09-17 NOTE — Assessment & Plan Note (Signed)
 New  Left distal upper arm pain - not cardiac in nature Likely musculoskeletal or radiculopathy No current pain here except when he had his arm in a certain position leaning backwards that seems to cause a little bit of discomfort He will monitor for now Reassured him this is not cardiac in nature He will let me know if pain is not improving/resolving

## 2024-09-18 ENCOUNTER — Ambulatory Visit: Payer: Self-pay | Admitting: Internal Medicine

## 2024-10-24 ENCOUNTER — Other Ambulatory Visit: Payer: Self-pay | Admitting: Internal Medicine

## 2024-11-24 ENCOUNTER — Encounter: Payer: Self-pay | Admitting: Internal Medicine

## 2025-09-21 ENCOUNTER — Encounter: Admitting: Internal Medicine
